# Patient Record
Sex: Male | Born: 1995 | Race: Black or African American | Hispanic: No | Marital: Single | Smoking: Never smoker
Health system: Southern US, Community
[De-identification: ages and names within clinical notes are randomized; demographics above are authoritative.]

## PROBLEM LIST (undated history)

## (undated) ENCOUNTER — Emergency Department (HOSPITAL_COMMUNITY): Payer: Commercial Managed Care - HMO | Source: Home / Self Care

## (undated) DIAGNOSIS — I10 Essential (primary) hypertension: Secondary | ICD-10-CM

## (undated) DIAGNOSIS — E559 Vitamin D deficiency, unspecified: Secondary | ICD-10-CM

## (undated) DIAGNOSIS — M869 Osteomyelitis, unspecified: Secondary | ICD-10-CM

## (undated) DIAGNOSIS — E611 Iron deficiency: Secondary | ICD-10-CM

## (undated) HISTORY — PX: FRACTURE SURGERY: SHX138

---
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
## 2015-04-24 ENCOUNTER — Inpatient Hospital Stay (HOSPITAL_COMMUNITY): Payer: Commercial Managed Care - HMO | Admitting: Anesthesiology

## 2015-04-24 ENCOUNTER — Inpatient Hospital Stay (HOSPITAL_COMMUNITY): Payer: Commercial Managed Care - HMO

## 2015-04-24 ENCOUNTER — Inpatient Hospital Stay (HOSPITAL_COMMUNITY)
Admission: EM | Admit: 2015-04-24 | Discharge: 2015-05-01 | DRG: 481 | Disposition: A | Discharge status: Rehabilitation Facility | Payer: Commercial Managed Care - HMO | Attending: Orthopedic Surgery | Admitting: Orthopedic Surgery

## 2015-04-24 ENCOUNTER — Encounter (HOSPITAL_COMMUNITY): Payer: Self-pay

## 2015-04-24 ENCOUNTER — Encounter (HOSPITAL_COMMUNITY): Admission: EM | Disposition: A | Discharge status: Rehabilitation Facility | Payer: Self-pay | Source: Home / Self Care

## 2015-04-24 ENCOUNTER — Emergency Department (HOSPITAL_COMMUNITY): Payer: Commercial Managed Care - HMO

## 2015-04-24 DIAGNOSIS — R7309 Other abnormal glucose: Secondary | ICD-10-CM | POA: Diagnosis present

## 2015-04-24 DIAGNOSIS — S71112S Laceration without foreign body, left thigh, sequela: Secondary | ICD-10-CM | POA: Diagnosis not present

## 2015-04-24 DIAGNOSIS — Z6841 Body Mass Index (BMI) 40.0 and over, adult: Secondary | ICD-10-CM | POA: Diagnosis not present

## 2015-04-24 DIAGNOSIS — D62 Acute posthemorrhagic anemia: Secondary | ICD-10-CM | POA: Diagnosis not present

## 2015-04-24 DIAGNOSIS — Z23 Encounter for immunization: Secondary | ICD-10-CM | POA: Diagnosis not present

## 2015-04-24 DIAGNOSIS — R7303 Prediabetes: Secondary | ICD-10-CM | POA: Diagnosis present

## 2015-04-24 DIAGNOSIS — S329XXG Fracture of unspecified parts of lumbosacral spine and pelvis, subsequent encounter for fracture with delayed healing: Secondary | ICD-10-CM | POA: Diagnosis not present

## 2015-04-24 DIAGNOSIS — S51011S Laceration without foreign body of right elbow, sequela: Secondary | ICD-10-CM | POA: Diagnosis not present

## 2015-04-24 DIAGNOSIS — L02416 Cutaneous abscess of left lower limb: Secondary | ICD-10-CM | POA: Diagnosis not present

## 2015-04-24 DIAGNOSIS — S32452A Displaced transverse fracture of left acetabulum, initial encounter for closed fracture: Secondary | ICD-10-CM | POA: Diagnosis present

## 2015-04-24 DIAGNOSIS — S32810A Multiple fractures of pelvis with stable disruption of pelvic ring, initial encounter for closed fracture: Principal | ICD-10-CM | POA: Diagnosis present

## 2015-04-24 DIAGNOSIS — E669 Obesity, unspecified: Secondary | ICD-10-CM | POA: Diagnosis present

## 2015-04-24 DIAGNOSIS — R609 Edema, unspecified: Secondary | ICD-10-CM | POA: Diagnosis not present

## 2015-04-24 DIAGNOSIS — S41121A Laceration with foreign body of right upper arm, initial encounter: Secondary | ICD-10-CM | POA: Diagnosis present

## 2015-04-24 DIAGNOSIS — Z419 Encounter for procedure for purposes other than remedying health state, unspecified: Secondary | ICD-10-CM

## 2015-04-24 DIAGNOSIS — S71112A Laceration without foreign body, left thigh, initial encounter: Secondary | ICD-10-CM | POA: Diagnosis present

## 2015-04-24 DIAGNOSIS — S0081XA Abrasion of other part of head, initial encounter: Secondary | ICD-10-CM | POA: Diagnosis present

## 2015-04-24 DIAGNOSIS — S32402A Unspecified fracture of left acetabulum, initial encounter for closed fracture: Secondary | ICD-10-CM | POA: Diagnosis present

## 2015-04-24 DIAGNOSIS — S32402S Unspecified fracture of left acetabulum, sequela: Secondary | ICD-10-CM | POA: Diagnosis not present

## 2015-04-24 DIAGNOSIS — S32810D Multiple fractures of pelvis with stable disruption of pelvic ring, subsequent encounter for fracture with routine healing: Secondary | ICD-10-CM | POA: Diagnosis not present

## 2015-04-24 DIAGNOSIS — S51011A Laceration without foreign body of right elbow, initial encounter: Secondary | ICD-10-CM | POA: Diagnosis present

## 2015-04-24 DIAGNOSIS — S32402D Unspecified fracture of left acetabulum, subsequent encounter for fracture with routine healing: Secondary | ICD-10-CM | POA: Diagnosis not present

## 2015-04-24 DIAGNOSIS — B9689 Other specified bacterial agents as the cause of diseases classified elsewhere: Secondary | ICD-10-CM | POA: Diagnosis not present

## 2015-04-24 DIAGNOSIS — S329XXA Fracture of unspecified parts of lumbosacral spine and pelvis, initial encounter for closed fracture: Secondary | ICD-10-CM

## 2015-04-24 DIAGNOSIS — M8618 Other acute osteomyelitis, other site: Secondary | ICD-10-CM | POA: Diagnosis not present

## 2015-04-24 DIAGNOSIS — S32810S Multiple fractures of pelvis with stable disruption of pelvic ring, sequela: Secondary | ICD-10-CM | POA: Diagnosis not present

## 2015-04-24 HISTORY — PX: INCISION AND DRAINAGE: SHX5863

## 2015-04-24 HISTORY — PX: SACRO-ILIAC PINNING: SHX5050

## 2015-04-24 HISTORY — PX: ORIF ACETABULAR FRACTURE: SHX5029

## 2015-04-24 HISTORY — PX: ORIF PELVIC FRACTURE: SHX2128

## 2015-04-24 LAB — ETHANOL: Alcohol, Ethyl (B): <5 mg/dL (ref <5)

## 2015-04-24 LAB — I-STAT CHEM 8, ED
BUN: 15 mg/dL (ref 6–20)
CREATININE: 1 mg/dL (ref 0.61–1.24)
Calcium, Ion: 1.07 mmol/L — ABNORMAL LOW (ref 1.12–1.23)
Chloride: 105 mmol/L (ref 101–111)
GLUCOSE: 150 mg/dL — AB (ref 65–99)
HCT: 44 % (ref 39.0–52.0)
Hemoglobin: 15 g/dL (ref 13.0–17.0)
Potassium: 3.4 mmol/L — ABNORMAL LOW (ref 3.5–5.1)
SODIUM: 141 mmol/L (ref 135–145)
TCO2: 22 mmol/L (ref 0–100)

## 2015-04-24 LAB — POCT I-STAT 7, (LYTES, BLD GAS, ICA,H+H)
Acid-base deficit: 3 mmol/L — ABNORMAL HIGH (ref 0.0–2.0)
BICARBONATE: 22.2 meq/L (ref 20.0–24.0)
Calcium, Ion: 1.17 mmol/L (ref 1.12–1.23)
HCT: 35 % — ABNORMAL LOW (ref 39.0–52.0)
HEMOGLOBIN: 11.9 g/dL — AB (ref 13.0–17.0)
O2 Saturation: 99 %
POTASSIUM: 4.3 mmol/L (ref 3.5–5.1)
Patient temperature: 37.3
SODIUM: 137 mmol/L (ref 135–145)
TCO2: 23 mmol/L (ref 0–100)
pCO2 arterial: 42.1 mmHg (ref 35.0–45.0)
pH, Arterial: 7.332 — ABNORMAL LOW (ref 7.350–7.450)
pO2, Arterial: 157 mmHg — ABNORMAL HIGH (ref 80.0–100.0)

## 2015-04-24 LAB — COMPREHENSIVE METABOLIC PANEL
ALK PHOS: 38 U/L (ref 38–126)
ALT: 59 U/L (ref 17–63)
AST: 53 U/L — ABNORMAL HIGH (ref 15–41)
Albumin: 3.6 g/dL (ref 3.5–5.0)
Anion gap: 11 (ref 5–15)
BILIRUBIN TOTAL: 0.5 mg/dL (ref 0.3–1.2)
BUN: 12 mg/dL (ref 6–20)
CO2: 23 mmol/L (ref 22–32)
Calcium: 8.9 mg/dL (ref 8.9–10.3)
Chloride: 107 mmol/L (ref 101–111)
Creatinine, Ser: 1.01 mg/dL (ref 0.61–1.24)
GFR calc Af Amer: >60 mL/min (ref >60)
GLUCOSE: 149 mg/dL — AB (ref 65–99)
POTASSIUM: 3.7 mmol/L (ref 3.5–5.1)
Sodium: 141 mmol/L (ref 135–145)
Total Protein: 6.3 g/dL — ABNORMAL LOW (ref 6.5–8.1)

## 2015-04-24 LAB — CBC
HCT: 43.4 % (ref 39.0–52.0)
Hemoglobin: 14.9 g/dL (ref 13.0–17.0)
MCH: 28.7 pg (ref 26.0–34.0)
MCHC: 34.3 g/dL (ref 30.0–36.0)
MCV: 83.6 fL (ref 78.0–100.0)
PLATELETS: 214 10*3/uL (ref 150–400)
RBC: 5.19 MIL/uL (ref 4.22–5.81)
RDW: 12.4 % (ref 11.5–15.5)
WBC: 9.1 10*3/uL (ref 4.0–10.5)

## 2015-04-24 LAB — PROTIME-INR
INR: 0.97 (ref 0.00–1.49)
Prothrombin Time: 13.1 seconds (ref 11.6–15.2)

## 2015-04-24 LAB — PREPARE RBC (CROSSMATCH)

## 2015-04-24 LAB — ABO/RH: ABO/RH(D): A POS

## 2015-04-24 LAB — CDS SEROLOGY

## 2015-04-24 SURGERY — OPEN REDUCTION INTERNAL FIXATION (ORIF) ACETABULAR FRACTURE
Anesthesia: General | Site: Pelvis | Laterality: Left

## 2015-04-24 MED ORDER — LABETALOL HCL 5 MG/ML IV SOLN
10.0000 mg | INTRAVENOUS | Status: DC | PRN
Start: 2015-04-24 — End: 2015-04-24
  Administered 2015-04-24 (×3): 10 mg via INTRAVENOUS

## 2015-04-24 MED ORDER — KCL IN DEXTROSE-NACL 20-5-0.45 MEQ/L-%-% IV SOLN
INTRAVENOUS | Status: DC
  Administered 2015-04-24: 23:00:00 via INTRAVENOUS
  Filled 2015-04-24 (×2): qty 1000

## 2015-04-24 MED ORDER — MIDAZOLAM HCL 2 MG/2ML IJ SOLN
INTRAMUSCULAR | Status: AC
Start: 2015-04-24 — End: 2015-04-24
  Filled 2015-04-24: qty 4

## 2015-04-24 MED ORDER — SUCCINYLCHOLINE CHLORIDE 20 MG/ML IJ SOLN
INTRAMUSCULAR | Status: DC | PRN
  Administered 2015-04-24: 200 mg via INTRAVENOUS

## 2015-04-24 MED ORDER — LIDOCAINE HCL (CARDIAC) 20 MG/ML IV SOLN
INTRAVENOUS | Status: DC | PRN
  Administered 2015-04-24: 40 mg via INTRAVENOUS

## 2015-04-24 MED ORDER — PROPOFOL 10 MG/ML IV BOLUS
INTRAVENOUS | Status: AC
  Filled 2015-04-24: qty 20

## 2015-04-24 MED ORDER — HYDROMORPHONE HCL 1 MG/ML IJ SOLN
INTRAMUSCULAR | Status: AC
  Administered 2015-04-24: 1 mg
  Filled 2015-04-24: qty 1

## 2015-04-24 MED ORDER — NEOSTIGMINE METHYLSULFATE 10 MG/10ML IV SOLN
INTRAVENOUS | Status: AC
  Filled 2015-04-24: qty 4

## 2015-04-24 MED ORDER — KETAMINE HCL 100 MG/ML IJ SOLN
INTRAMUSCULAR | Status: AC
  Filled 2015-04-24: qty 1

## 2015-04-24 MED ORDER — FENTANYL CITRATE (PF) 250 MCG/5ML IJ SOLN
INTRAMUSCULAR | Status: AC
  Filled 2015-04-24: qty 5

## 2015-04-24 MED ORDER — FENTANYL CITRATE (PF) 100 MCG/2ML IJ SOLN
INTRAMUSCULAR | Status: DC | PRN
  Administered 2015-04-24 (×4): 50 ug via INTRAVENOUS
  Administered 2015-04-24: 100 ug via INTRAVENOUS
  Administered 2015-04-24 (×6): 50 ug via INTRAVENOUS
  Administered 2015-04-24: 150 ug via INTRAVENOUS
  Administered 2015-04-24: 100 ug via INTRAVENOUS
  Administered 2015-04-24 (×2): 50 ug via INTRAVENOUS
  Administered 2015-04-24: 100 ug via INTRAVENOUS
  Administered 2015-04-24 (×9): 50 ug via INTRAVENOUS

## 2015-04-24 MED ORDER — HYDROMORPHONE HCL 1 MG/ML IJ SOLN
INTRAMUSCULAR | Status: AC
  Filled 2015-04-24: qty 2

## 2015-04-24 MED ORDER — NEOSTIGMINE METHYLSULFATE 10 MG/10ML IV SOLN
INTRAVENOUS | Status: AC
  Filled 2015-04-24: qty 1

## 2015-04-24 MED ORDER — SUGAMMADEX SODIUM 500 MG/5ML IV SOLN
INTRAVENOUS | Status: AC
  Filled 2015-04-24: qty 5

## 2015-04-24 MED ORDER — ONDANSETRON HCL 4 MG/2ML IJ SOLN
INTRAMUSCULAR | Status: AC
  Administered 2015-04-24: 4 mg via INTRAVENOUS
  Filled 2015-04-24: qty 2

## 2015-04-24 MED ORDER — HYDROMORPHONE HCL 1 MG/ML IJ SOLN
1.0000 mg | Freq: Once | INTRAMUSCULAR | Status: AC
  Administered 2015-04-26: 1 mg via INTRAVENOUS
  Filled 2015-04-24: qty 1

## 2015-04-24 MED ORDER — LABETALOL HCL 5 MG/ML IV SOLN
INTRAVENOUS | Status: AC
  Filled 2015-04-24: qty 4

## 2015-04-24 MED ORDER — SUCCINYLCHOLINE CHLORIDE 20 MG/ML IJ SOLN
INTRAMUSCULAR | Status: AC
  Filled 2015-04-24: qty 1

## 2015-04-24 MED ORDER — ROCURONIUM BROMIDE 100 MG/10ML IV SOLN
INTRAVENOUS | Status: DC | PRN
  Administered 2015-04-24: 10 mg via INTRAVENOUS
  Administered 2015-04-24: 50 mg via INTRAVENOUS
  Administered 2015-04-24: 20 mg via INTRAVENOUS
  Administered 2015-04-24: 10 mg via INTRAVENOUS
  Administered 2015-04-24: 20 mg via INTRAVENOUS
  Administered 2015-04-24: 30 mg via INTRAVENOUS
  Administered 2015-04-24 (×2): 20 mg via INTRAVENOUS
  Administered 2015-04-24 (×2): 10 mg via INTRAVENOUS
  Administered 2015-04-24: 20 mg via INTRAVENOUS
  Administered 2015-04-24: 10 mg via INTRAVENOUS

## 2015-04-24 MED ORDER — TETANUS-DIPHTH-ACELL PERTUSSIS 5-2.5-18.5 LF-MCG/0.5 IM SUSP
0.5000 mL | Freq: Once | INTRAMUSCULAR | Status: AC
  Administered 2015-04-24: 0.5 mL via INTRAMUSCULAR
  Filled 2015-04-24: qty 0.5

## 2015-04-24 MED ORDER — LACTATED RINGERS IV SOLN
INTRAVENOUS | Status: DC
  Administered 2015-04-24 (×2): 50 mL/h via INTRAVENOUS

## 2015-04-24 MED ORDER — HYDROMORPHONE HCL 1 MG/ML IJ SOLN
INTRAMUSCULAR | Status: AC | PRN
  Administered 2015-04-24: 0.5 mg via INTRAVENOUS

## 2015-04-24 MED ORDER — CEFAZOLIN SODIUM-DEXTROSE 2-3 GM-% IV SOLR
INTRAVENOUS | Status: AC
  Filled 2015-04-24: qty 50

## 2015-04-24 MED ORDER — LACTATED RINGERS IV SOLN
INTRAVENOUS | Status: DC | PRN
  Administered 2015-04-24 (×2): via INTRAVENOUS

## 2015-04-24 MED ORDER — ONDANSETRON HCL 4 MG/2ML IJ SOLN
INTRAMUSCULAR | Status: AC
  Filled 2015-04-24: qty 2

## 2015-04-24 MED ORDER — HYDROMORPHONE HCL 1 MG/ML IJ SOLN
1.0000 mg | INTRAMUSCULAR | Status: DC | PRN
Start: 2015-04-24 — End: 2015-04-25
  Administered 2015-04-24 – 2015-04-25 (×3): 1 mg via INTRAVENOUS
  Filled 2015-04-24 (×3): qty 1

## 2015-04-24 MED ORDER — ONDANSETRON HCL 4 MG PO TABS: 4.0000 mg | ORAL_TABLET | Freq: Four times a day (QID) | ORAL | Status: DC | PRN

## 2015-04-24 MED ORDER — HYDROMORPHONE HCL 1 MG/ML IJ SOLN
0.2500 mg | INTRAMUSCULAR | Status: DC | PRN
  Administered 2015-04-24 (×2): 1 mg via INTRAVENOUS

## 2015-04-24 MED ORDER — ACETAMINOPHEN 10 MG/ML IV SOLN
INTRAVENOUS | Status: DC | PRN
  Administered 2015-04-24: 1000 mg via INTRAVENOUS

## 2015-04-24 MED ORDER — PHENYLEPHRINE HCL 10 MG/ML IJ SOLN
10.0000 mg | INTRAVENOUS | Status: DC | PRN
  Administered 2015-04-24: 80 ug/min via INTRAVENOUS

## 2015-04-24 MED ORDER — HYDROMORPHONE HCL 1 MG/ML IJ SOLN
INTRAMUSCULAR | Status: AC
  Filled 2015-04-24: qty 1

## 2015-04-24 MED ORDER — ONDANSETRON HCL 4 MG/2ML IJ SOLN
4.0000 mg | Freq: Four times a day (QID) | INTRAMUSCULAR | Status: DC | PRN
  Filled 2015-04-24: qty 2

## 2015-04-24 MED ORDER — GLYCOPYRROLATE 0.2 MG/ML IJ SOLN
INTRAMUSCULAR | Status: AC
  Filled 2015-04-24: qty 3

## 2015-04-24 MED ORDER — ONDANSETRON HCL 4 MG/2ML IJ SOLN
INTRAMUSCULAR | Status: AC | PRN
  Administered 2015-04-24: 4 mg via INTRAVENOUS

## 2015-04-24 MED ORDER — ROCURONIUM BROMIDE 50 MG/5ML IV SOLN
INTRAVENOUS | Status: AC
  Filled 2015-04-24: qty 2

## 2015-04-24 MED ORDER — MIDAZOLAM HCL 5 MG/5ML IJ SOLN
INTRAMUSCULAR | Status: DC | PRN
  Administered 2015-04-24: 2 mg via INTRAVENOUS

## 2015-04-24 MED ORDER — PROMETHAZINE HCL 25 MG/ML IJ SOLN: 6.2500 mg | INTRAMUSCULAR | Status: DC | PRN

## 2015-04-24 MED ORDER — PHENYLEPHRINE 40 MCG/ML (10ML) SYRINGE FOR IV PUSH (FOR BLOOD PRESSURE SUPPORT)
PREFILLED_SYRINGE | INTRAVENOUS | Status: AC
  Filled 2015-04-24: qty 50

## 2015-04-24 MED ORDER — ACETAMINOPHEN 10 MG/ML IV SOLN
INTRAVENOUS | Status: AC
  Filled 2015-04-24: qty 100

## 2015-04-24 MED ORDER — SUGAMMADEX SODIUM 200 MG/2ML IV SOLN
INTRAVENOUS | Status: DC | PRN
  Administered 2015-04-24: 249.4 mg via INTRAVENOUS

## 2015-04-24 MED ORDER — ALBUMIN HUMAN 5 % IV SOLN
INTRAVENOUS | Status: DC | PRN
  Administered 2015-04-24: 17:00:00 via INTRAVENOUS

## 2015-04-24 MED ORDER — KETAMINE HCL 10 MG/ML IJ SOLN
INTRAMUSCULAR | Status: DC | PRN
  Administered 2015-04-24 (×2): 10 mg via INTRAVENOUS
  Administered 2015-04-24: 20 mg via INTRAVENOUS

## 2015-04-24 MED ORDER — PHENYLEPHRINE HCL 10 MG/ML IJ SOLN
INTRAMUSCULAR | Status: DC | PRN
  Administered 2015-04-24 (×10): 80 ug via INTRAVENOUS

## 2015-04-24 MED ORDER — PANTOPRAZOLE SODIUM 40 MG PO TBEC
40.0000 mg | DELAYED_RELEASE_TABLET | Freq: Every day | ORAL | Status: DC
  Administered 2015-04-25 – 2015-04-27 (×3): 40 mg via ORAL
  Filled 2015-04-24 (×3): qty 1

## 2015-04-24 MED ORDER — CEFAZOLIN SODIUM 10 G IJ SOLR
3.0000 g | INTRAMUSCULAR | Status: AC
  Filled 2015-04-24 (×3): qty 3000

## 2015-04-24 MED ORDER — 0.9 % SODIUM CHLORIDE (POUR BTL) OPTIME
TOPICAL | Status: DC | PRN
  Administered 2015-04-24 (×3): 1000 mL

## 2015-04-24 MED ORDER — HYDROMORPHONE HCL 1 MG/ML IJ SOLN
1.0000 mg | Freq: Once | INTRAMUSCULAR | Status: AC
  Administered 2015-04-24: 1 mg via INTRAVENOUS
  Filled 2015-04-24: qty 1

## 2015-04-24 MED ORDER — PROPOFOL 10 MG/ML IV BOLUS
INTRAVENOUS | Status: DC | PRN
  Administered 2015-04-24: 200 mg via INTRAVENOUS

## 2015-04-24 MED ORDER — MIDAZOLAM HCL 2 MG/2ML IJ SOLN
INTRAMUSCULAR | Status: AC
  Filled 2015-04-24: qty 2

## 2015-04-24 MED ORDER — SODIUM CHLORIDE 0.9 % IV SOLN: 10.0000 mL/h | Freq: Once | INTRAVENOUS | Status: DC

## 2015-04-24 MED ORDER — MIDAZOLAM HCL 2 MG/2ML IJ SOLN
0.5000 mg | Freq: Once | INTRAMUSCULAR | Status: AC | PRN
Start: 2015-04-24 — End: 2015-04-24
  Administered 2015-04-24: 2 mg via INTRAVENOUS

## 2015-04-24 MED ORDER — HYDROMORPHONE HCL 1 MG/ML IJ SOLN
0.2500 mg | INTRAMUSCULAR | Status: DC | PRN
  Administered 2015-04-24: 1 mg via INTRAVENOUS

## 2015-04-24 MED ORDER — PANTOPRAZOLE SODIUM 40 MG IV SOLR
40.0000 mg | Freq: Every day | INTRAVENOUS | Status: DC
  Filled 2015-04-24: qty 40

## 2015-04-24 MED ORDER — LIDOCAINE HCL (CARDIAC) 20 MG/ML IV SOLN
INTRAVENOUS | Status: AC
  Filled 2015-04-24: qty 5

## 2015-04-24 MED ORDER — IOHEXOL 300 MG/ML  SOLN
100.0000 mL | Freq: Once | INTRAMUSCULAR | Status: AC | PRN
  Administered 2015-04-24: 100 mL via INTRAVENOUS

## 2015-04-24 MED ORDER — DEXTROSE 5 % IV SOLN
3.0000 g | INTRAVENOUS | Status: DC | PRN
  Administered 2015-04-24 (×2): 3 g via INTRAVENOUS

## 2015-04-24 MED ORDER — LIDOCAINE-EPINEPHRINE 1 %-1:100000 IJ SOLN
20.0000 mL | Freq: Once | INTRAMUSCULAR | Status: DC
  Filled 2015-04-24: qty 1

## 2015-04-24 MED ORDER — ROCURONIUM BROMIDE 50 MG/5ML IV SOLN
INTRAVENOUS | Status: AC
  Filled 2015-04-24: qty 1

## 2015-04-24 MED ORDER — MEPERIDINE HCL 25 MG/ML IJ SOLN: 6.2500 mg | INTRAMUSCULAR | Status: DC | PRN

## 2015-04-24 MED ORDER — SODIUM CHLORIDE 0.9 % IR SOLN
Status: DC | PRN
  Administered 2015-04-24: 3000 mL

## 2015-04-24 SURGICAL SUPPLY — 115 items
APPLIER CLIP 11 MED OPEN (CLIP) ×4
APPLIER CLIP 13 LRG OPEN (CLIP) ×4
APR CLP LRG 13 20 CLIP (CLIP) ×3
APR CLP MED 11 20 MLT OPN (CLIP) ×3
BIT DRILL AO MATTA 2.5MX230M (BIT) IMPLANT
BIT DRILL SCALD PELVS II 2.5MM (BIT) IMPLANT
BLADE SURG 10 STRL SS (BLADE) ×2 IMPLANT
BLADE SURG 15 STRL LF DISP TIS (BLADE) ×3 IMPLANT
BLADE SURG 15 STRL SS (BLADE) ×4
BLADE SURG ROTATE 9660 (MISCELLANEOUS) ×1 IMPLANT
BNDG GAUZE ELAST 4 BULKY (GAUZE/BANDAGES/DRESSINGS) ×1 IMPLANT
BRUSH SCRUB DISP (MISCELLANEOUS) ×10 IMPLANT
CANISTER WOUND CARE 500ML ATS (WOUND CARE) ×1 IMPLANT
CLIP APPLIE 11 MED OPEN (CLIP) IMPLANT
CLIP APPLIE 13 LRG OPEN (CLIP) IMPLANT
COVER SURGICAL LIGHT HANDLE (MISCELLANEOUS) ×8 IMPLANT
DRAIN CHANNEL 10F 3/8 F FF (DRAIN) IMPLANT
DRAIN CHANNEL 15F RND FF W/TCR (WOUND CARE) IMPLANT
DRAIN PENROSE 3/4X12 (DRAIN) ×1 IMPLANT
DRAPE C-ARM 42X72 X-RAY (DRAPES) ×1 IMPLANT
DRAPE C-ARMOR (DRAPES) ×4 IMPLANT
DRAPE IMP U-DRAPE 54X76 (DRAPES) ×3 IMPLANT
DRAPE INCISE IOBAN 66X45 STRL (DRAPES) ×4 IMPLANT
DRAPE INCISE IOBAN 85X60 (DRAPES) ×8 IMPLANT
DRAPE LAPAROTOMY TRNSV 102X78 (DRAPE) ×4 IMPLANT
DRAPE ORTHO SPLIT 77X108 STRL (DRAPES) ×8
DRAPE SURG ORHT 6 SPLT 77X108 (DRAPES) ×6 IMPLANT
DRAPE U-SHAPE 47X51 STRL (DRAPES) ×3 IMPLANT
DRILL BIT AO MATTA 2.5MX230M (BIT) ×4
DRILL SCALED PELVIS II 2.5MM (BIT) ×4
DRSG MEPILEX BORDER 4X4 (GAUZE/BANDAGES/DRESSINGS) ×2 IMPLANT
DRSG MEPILEX BORDER 4X8 (GAUZE/BANDAGES/DRESSINGS) ×1 IMPLANT
DRSG MEPITEL 4X7.2 (GAUZE/BANDAGES/DRESSINGS) ×1 IMPLANT
DRSG PAD ABDOMINAL 8X10 ST (GAUZE/BANDAGES/DRESSINGS) ×6 IMPLANT
DRSG VAC ATS SM SENSATRAC (GAUZE/BANDAGES/DRESSINGS) ×1 IMPLANT
DRSG VERSA FOAM LRG 10X15 (GAUZE/BANDAGES/DRESSINGS) ×2 IMPLANT
ELECT BLADE 6.5 EXT (BLADE) ×2 IMPLANT
ELECT REM PT RETURN 9FT ADLT (ELECTROSURGICAL) ×4
ELECTRODE REM PT RTRN 9FT ADLT (ELECTROSURGICAL) ×3 IMPLANT
EVACUATOR 1/8 PVC DRAIN (DRAIN) IMPLANT
EVACUATOR SILICONE 100CC (DRAIN) ×3 IMPLANT
GAUZE SPONGE 4X4 12PLY STRL (GAUZE/BANDAGES/DRESSINGS) ×4 IMPLANT
GAUZE SPONGE 4X4 16PLY XRAY LF (GAUZE/BANDAGES/DRESSINGS) ×3 IMPLANT
GAUZE XEROFORM 5X9 LF (GAUZE/BANDAGES/DRESSINGS) ×3 IMPLANT
GLOVE BIO SURGEON STRL SZ7.5 (GLOVE) ×3 IMPLANT
GLOVE BIO SURGEON STRL SZ8 (GLOVE) ×6 IMPLANT
GLOVE BIOGEL PI IND STRL 7.5 (GLOVE) ×3 IMPLANT
GLOVE BIOGEL PI IND STRL 8 (GLOVE) ×3 IMPLANT
GLOVE BIOGEL PI INDICATOR 7.5 (GLOVE)
GLOVE BIOGEL PI INDICATOR 8 (GLOVE) ×2
GOWN STRL REUS W/ TWL LRG LVL3 (GOWN DISPOSABLE) ×6 IMPLANT
GOWN STRL REUS W/ TWL XL LVL3 (GOWN DISPOSABLE) ×3 IMPLANT
GOWN STRL REUS W/TWL 2XL LVL3 (GOWN DISPOSABLE) ×3 IMPLANT
GOWN STRL REUS W/TWL LRG LVL3 (GOWN DISPOSABLE) ×8
GOWN STRL REUS W/TWL XL LVL3 (GOWN DISPOSABLE) ×4
GUIDE PIN 2.8 X 300 ×2 IMPLANT
GUIDEPIN 2.8X300 THRD TIP OIC (Screw) ×1 IMPLANT
HANDPIECE INTERPULSE COAX TIP (DISPOSABLE)
K-WIRE 3.2X150 (WIRE) ×8
KIT BASIN OR (CUSTOM PROCEDURE TRAY) ×4 IMPLANT
KIT ROOM TURNOVER OR (KITS) ×4 IMPLANT
KWIRE 3.2X150 (WIRE) IMPLANT
LIGHT ORTHO (MISCELLANEOUS) ×1 IMPLANT
LOOP VESSEL MAXI BLUE (MISCELLANEOUS) ×1 IMPLANT
MANIFOLD NEPTUNE WASTE (CANNULA) ×1 IMPLANT
NDL MAYO TROCAR (NEEDLE) ×3 IMPLANT
NEEDLE MAYO TROCAR (NEEDLE) IMPLANT
NS IRRIG 1000ML POUR BTL (IV SOLUTION) ×5 IMPLANT
PACK GENERAL/GYN (CUSTOM PROCEDURE TRAY) ×3 IMPLANT
PACK TOTAL JOINT (CUSTOM PROCEDURE TRAY) ×4 IMPLANT
PACK UNIVERSAL I (CUSTOM PROCEDURE TRAY) ×3 IMPLANT
PAD ARMBOARD 7.5X6 YLW CONV (MISCELLANEOUS) ×10 IMPLANT
PENCIL BUTTON HOLSTER BLD 10FT (ELECTRODE) ×1 IMPLANT
PIPE LIGHT STORZ FITTING (MISCELLANEOUS) ×1 IMPLANT
PLATE ACET STRT 94.5M 8H (Plate) ×1 IMPLANT
PLATE INFRAPECTINEAL L L (Plate) ×1 IMPLANT
PLATE INFRAPECTINEAL S L (Plate) ×1 IMPLANT
RETRIEVER SUT HEWSON (MISCELLANEOUS) IMPLANT
SCREW CANN 7.3X90 32MM THRD (Screw) ×1 IMPLANT
SCREW CORT 48X3.5XST NS LF (Screw) IMPLANT
SCREW CORTEX ST MATTA 3.5X18MM (Screw) ×1 IMPLANT
SCREW CORTEX ST MATTA 3.5X20 (Screw) ×1 IMPLANT
SCREW CORTEX ST MATTA 3.5X22MM (Screw) ×1 IMPLANT
SCREW CORTEX ST MATTA 3.5X24 (Screw) ×2 IMPLANT
SCREW CORTEX ST MATTA 3.5X26MM (Screw) ×1 IMPLANT
SCREW CORTEX ST MATTA 3.5X28MM (Screw) ×2 IMPLANT
SCREW CORTEX ST MATTA 3.5X30MM (Screw) ×3 IMPLANT
SCREW CORTEX ST MATTA 3.5X32MM (Screw) ×1 IMPLANT
SCREW CORTEX ST MATTA 3.5X34MM (Screw) ×1 IMPLANT
SCREW CORTEX ST MATTA 3.5X50MM (Screw) ×1 IMPLANT
SCREW CORTEX ST MATTA 3.5X70MM (Screw) ×1 IMPLANT
SCREW CORTICAL 3.5X48MM (Screw) ×8 IMPLANT
SET HNDPC FAN SPRY TIP SCT (DISPOSABLE) IMPLANT
SPONGE LAP 18X18 X RAY DECT (DISPOSABLE) ×12 IMPLANT
STAPLER VISISTAT 35W (STAPLE) ×4 IMPLANT
STRIP CLOSURE SKIN 1/2X4 (GAUZE/BANDAGES/DRESSINGS) ×4 IMPLANT
SUCTION FRAZIER TIP 10 FR DISP (SUCTIONS) ×4 IMPLANT
SUT ETHILON 3 0 PS 1 (SUTURE) ×6 IMPLANT
SUT FIBERWIRE #2 38 T-5 BLUE (SUTURE)
SUT PDS AB 2-0 CT1 27 (SUTURE) ×2 IMPLANT
SUT SILK 2 0 TIES 10X30 (SUTURE) ×1 IMPLANT
SUT VIC AB 0 CT1 27 (SUTURE) ×8
SUT VIC AB 0 CT1 27XBRD ANBCTR (SUTURE) ×3 IMPLANT
SUT VIC AB 1 CT1 18XCR BRD 8 (SUTURE) ×3 IMPLANT
SUT VIC AB 1 CT1 8-18 (SUTURE) ×8
SUT VIC AB 2-0 CT1 27 (SUTURE) ×8
SUT VIC AB 2-0 CT1 TAPERPNT 27 (SUTURE) ×3 IMPLANT
SUT VIC AB 2-0 FS1 27 (SUTURE) ×3 IMPLANT
SUTURE FIBERWR #2 38 T-5 BLUE (SUTURE) IMPLANT
TOWEL OR 17X24 6PK STRL BLUE (TOWEL DISPOSABLE) ×4 IMPLANT
TOWEL OR 17X26 10 PK STRL BLUE (TOWEL DISPOSABLE) ×8 IMPLANT
TRAY FOLEY CATH 16FRSI W/METER (SET/KITS/TRAYS/PACK) ×1 IMPLANT
TUBE CONNECTING 12X1/4 (SUCTIONS) ×1 IMPLANT
UNDERPAD 30X30 INCONTINENT (UNDERPADS AND DIAPERS) ×4 IMPLANT
WASHER SCREW MATTA SS 13.0X1.5 (Washer) ×1 IMPLANT

## 2015-04-24 NOTE — ED Notes (Signed)
Dr Burke at bedside.
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
## 2015-04-24 NOTE — ED Notes (Signed)
C-collar removed by Dr Clarene Duke.
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
## 2015-04-24 NOTE — ED Notes (Signed)
Dr Carola Frost to see pt in OR

## 2015-04-24 NOTE — Transfer of Care (Signed)
Immediate Anesthesia Transfer of Care Note  Patient: Mathew Norman  Procedure(s) Performed: Procedure(s): OPEN REDUCTION INTERNAL FIXATION (ORIF) ACETABULAR FRACTURE  (Left) SACRO-ILIAC PINNING (Left) INCISION AND DRAINAGE of left  thigh open wound, closure of right upper arm laceration. (Left) OPEN REDUCTION INTERNAL FIXATION (ORIF) PELVIC  RING FRACTURE (Left)  Patient Location: PACU  Anesthesia Type:General  Level of Consciousness: awake, alert , oriented and patient cooperative  Airway & Oxygen Therapy: Patient Spontanous Breathing and Patient connected to nasal cannula oxygen  Post-op Assessment: Report given to RN, Post -op Vital signs reviewed and stable, Patient moving all extremities, Patient moving all extremities X 4 and Patient able to stick tongue midline  Post vital signs: Reviewed and stable  Last Vitals:  Filed Vitals:   04/24/15 1045  BP: 120/57  Pulse: 103  Temp:   Resp: 8    Complications: no anesthesia complications

## 2015-04-24 NOTE — ED Notes (Signed)
Dr Clarene Duke suturing pt.
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
## 2015-04-24 NOTE — Anesthesia Procedure Notes (Signed)
Procedure Name: Intubation Date/Time: 04/24/2015 12:30 PM Performed by: [employer], Jannet Askew Pre-anesthesia Checklist: Patient identified, Emergency Drugs available, Suction available, Patient being monitored and Timeout performed Patient Re-evaluated:Patient Re-evaluated prior to inductionOxygen Delivery Method: Circle system utilized Preoxygenation: Pre-oxygenation with 100% oxygen Intubation Type: IV induction Laryngoscope Size: Mac and 4 Grade View: Grade I Tube size: 7.5 mm Number of attempts: 1 Airway Equipment and Method: Patient positioned with wedge pillow Placement Confirmation: ETT inserted through vocal cords under direct vision,  breath sounds checked- equal and bilateral and positive ETCO2 Secured at: 23 cm Dental Injury: Teeth and Oropharynx as per pre-operative assessment
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
## 2015-04-24 NOTE — ED Notes (Signed)
Pt leaving for CT.  Pain meds requested.  Pt highly anxious.

## 2015-04-25 ENCOUNTER — Encounter (HOSPITAL_COMMUNITY): Payer: Self-pay | Admitting: Orthopedic Surgery

## 2015-04-25 LAB — BASIC METABOLIC PANEL
Anion gap: 10 (ref 5–15)
BUN: 7 mg/dL (ref 6–20)
CALCIUM: 8 mg/dL — AB (ref 8.9–10.3)
CO2: 24 mmol/L (ref 22–32)
Chloride: 102 mmol/L (ref 101–111)
Creatinine, Ser: 0.86 mg/dL (ref 0.61–1.24)
GLUCOSE: 138 mg/dL — AB (ref 65–99)
Potassium: 3.6 mmol/L (ref 3.5–5.1)
SODIUM: 136 mmol/L (ref 135–145)

## 2015-04-25 LAB — CBC
HCT: 34.3 % — ABNORMAL LOW (ref 39.0–52.0)
HCT: 33.5 % — ABNORMAL LOW (ref 39.0–52.0)
HEMATOCRIT: 33.5 % — AB (ref 39.0–52.0)
HEMOGLOBIN: 11.5 g/dL — AB (ref 13.0–17.0)
Hemoglobin: 11.8 g/dL — ABNORMAL LOW (ref 13.0–17.0)
Hemoglobin: 11.3 g/dL — ABNORMAL LOW (ref 13.0–17.0)
MCH: 28.6 pg (ref 26.0–34.0)
MCH: 29.1 pg (ref 26.0–34.0)
MCH: 28.3 pg (ref 26.0–34.0)
MCHC: 34.4 g/dL (ref 30.0–36.0)
MCHC: 34.3 g/dL (ref 30.0–36.0)
MCHC: 33.7 g/dL (ref 30.0–36.0)
MCV: 83.3 fL (ref 78.0–100.0)
MCV: 84.8 fL (ref 78.0–100.0)
MCV: 84 fL (ref 78.0–100.0)
Platelets: 198 10*3/uL (ref 150–400)
Platelets: 191 10*3/uL (ref 150–400)
Platelets: 189 10*3/uL (ref 150–400)
RBC: 4.12 MIL/uL — ABNORMAL LOW (ref 4.22–5.81)
RBC: 3.95 MIL/uL — AB (ref 4.22–5.81)
RBC: 3.99 MIL/uL — AB (ref 4.22–5.81)
RDW: 12.8 % (ref 11.5–15.5)
RDW: 12.9 % (ref 11.5–15.5)
RDW: 12.7 % (ref 11.5–15.5)
WBC: 8 10*3/uL (ref 4.0–10.5)
WBC: 7.7 10*3/uL (ref 4.0–10.5)
WBC: 9 10*3/uL (ref 4.0–10.5)

## 2015-04-25 LAB — MRSA PCR SCREENING: MRSA BY PCR: NEGATIVE

## 2015-04-25 MED ORDER — METOPROLOL TARTRATE 1 MG/ML IV SOLN
5.0000 mg | Freq: Four times a day (QID) | INTRAVENOUS | Status: DC | PRN
  Filled 2015-04-25: qty 5

## 2015-04-25 MED ORDER — OXYCODONE-ACETAMINOPHEN 5-325 MG PO TABS
1.0000 | ORAL_TABLET | ORAL | Status: DC | PRN
  Administered 2015-04-25 (×2): 2 via ORAL
  Filled 2015-04-25 (×2): qty 2

## 2015-04-25 MED ORDER — SODIUM CHLORIDE 0.9 % IV SOLN: 250.0000 mL | INTRAVENOUS | Status: DC | PRN

## 2015-04-25 MED ORDER — ACETAMINOPHEN 325 MG PO TABS
650.0000 mg | ORAL_TABLET | Freq: Four times a day (QID) | ORAL | Status: DC | PRN
Start: 2015-04-25 — End: 2015-05-01
  Administered 2015-04-25 – 2015-04-28 (×5): 650 mg via ORAL
  Filled 2015-04-25 (×5): qty 2

## 2015-04-25 MED ORDER — DIPHENHYDRAMINE HCL 25 MG PO CAPS
25.0000 mg | ORAL_CAPSULE | Freq: Four times a day (QID) | ORAL | Status: DC | PRN
  Administered 2015-04-25: 25 mg via ORAL
  Filled 2015-04-25: qty 1

## 2015-04-25 MED ORDER — DEXTROSE 5 % IV SOLN
3.0000 g | INTRAVENOUS | Status: DC
  Filled 2015-04-25: qty 3000

## 2015-04-25 MED ORDER — SODIUM CHLORIDE 0.9 % IJ SOLN
3.0000 mL | Freq: Two times a day (BID) | INTRAMUSCULAR | Status: DC
  Administered 2015-04-25 – 2015-04-28 (×5): 3 mL via INTRAVENOUS

## 2015-04-25 MED ORDER — DIPHENHYDRAMINE HCL 50 MG/ML IJ SOLN: 25.0000 mg | Freq: Four times a day (QID) | INTRAMUSCULAR | Status: DC | PRN

## 2015-04-25 MED ORDER — METHOCARBAMOL 750 MG PO TABS
750.0000 mg | ORAL_TABLET | Freq: Three times a day (TID) | ORAL | Status: DC
  Administered 2015-04-25 – 2015-05-01 (×18): 750 mg via ORAL
  Filled 2015-04-25 (×22): qty 1

## 2015-04-25 MED ORDER — HYDROMORPHONE HCL 1 MG/ML IJ SOLN
1.0000 mg | INTRAMUSCULAR | Status: DC | PRN
  Administered 2015-04-25: 1 mg via INTRAVENOUS
  Administered 2015-04-25: 2 mg via INTRAVENOUS
  Filled 2015-04-25: qty 2

## 2015-04-25 MED ORDER — SODIUM CHLORIDE 0.9 % IJ SOLN: 3.0000 mL | INTRAMUSCULAR | Status: DC | PRN

## 2015-04-25 MED ORDER — HYDROMORPHONE HCL 1 MG/ML IJ SOLN
1.0000 mg | INTRAMUSCULAR | Status: DC | PRN
  Administered 2015-04-25 – 2015-04-27 (×11): 2 mg via INTRAVENOUS
  Administered 2015-04-27 – 2015-04-28 (×5): 1 mg via INTRAVENOUS
  Administered 2015-04-28: 2 mg via INTRAVENOUS
  Administered 2015-04-28 (×3): 1 mg via INTRAVENOUS
  Administered 2015-04-28: 2 mg via INTRAVENOUS
  Administered 2015-04-29: 1 mg via INTRAVENOUS
  Filled 2015-04-25: qty 2
  Filled 2015-04-25 (×6): qty 1
  Filled 2015-04-25: qty 2
  Filled 2015-04-25: qty 1
  Filled 2015-04-25 (×7): qty 2
  Filled 2015-04-25: qty 1
  Filled 2015-04-25 (×2): qty 2
  Filled 2015-04-25: qty 1
  Filled 2015-04-25 (×2): qty 2
  Filled 2015-04-25: qty 1

## 2015-04-25 MED ORDER — OXYCODONE HCL 5 MG PO TABS
10.0000 mg | ORAL_TABLET | ORAL | Status: DC | PRN
  Administered 2015-04-25 – 2015-04-29 (×14): 20 mg via ORAL
  Administered 2015-04-29: 10 mg via ORAL
  Administered 2015-04-29: 20 mg via ORAL
  Administered 2015-04-29: 10 mg via ORAL
  Administered 2015-04-29 – 2015-05-01 (×10): 20 mg via ORAL
  Filled 2015-04-25 (×6): qty 4
  Filled 2015-04-25: qty 2
  Filled 2015-04-25 (×11): qty 4
  Filled 2015-04-25: qty 2
  Filled 2015-04-25 (×8): qty 4
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
## 2015-04-26 NOTE — Progress Notes (Signed)
Pt report called to new room. All belongs packed and will be sent to new room with patient. Family at bedside.

## 2015-04-26 NOTE — Progress Notes (Signed)
Pt had a fever of 100.5 orally  upon admission at 3 pm.  Gave tylenol and encourage pt to use incentive spirometry q10 mins. Recheck at 1840, was 99.6 (axillary). Will continue to monitor pt.

## 2015-04-27 MED ORDER — DEXTROSE 5 % IV SOLN
3.0000 g | INTRAVENOUS | Status: DC
  Filled 2015-04-27 (×2): qty 3000

## 2015-04-27 MED ORDER — POTASSIUM CHLORIDE 2 MEQ/ML IV SOLN
INTRAVENOUS | Status: DC
  Administered 2015-04-28: 01:00:00 via INTRAVENOUS
  Filled 2015-04-27 (×4): qty 1000

## 2015-04-27 MED ORDER — DEXTROSE 5 % IV SOLN
3.0000 g | INTRAVENOUS | Status: DC
  Filled 2015-04-27: qty 3000
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
## 2015-04-28 ENCOUNTER — Encounter (HOSPITAL_COMMUNITY): Payer: Self-pay | Admitting: Certified Registered Nurse Anesthetist

## 2015-04-28 ENCOUNTER — Inpatient Hospital Stay (HOSPITAL_COMMUNITY): Payer: Commercial Managed Care - HMO | Admitting: Certified Registered"

## 2015-04-28 ENCOUNTER — Encounter (HOSPITAL_COMMUNITY): Admission: EM | Disposition: A | Discharge status: Rehabilitation Facility | Payer: Self-pay | Source: Home / Self Care

## 2015-04-28 HISTORY — PX: DEBRIDEMENT AND CLOSURE WOUND: SHX5614

## 2015-04-28 LAB — BASIC METABOLIC PANEL
Anion gap: 7 (ref 5–15)
BUN: 7 mg/dL (ref 6–20)
CHLORIDE: 98 mmol/L — AB (ref 101–111)
CO2: 27 mmol/L (ref 22–32)
Calcium: 8.1 mg/dL — ABNORMAL LOW (ref 8.9–10.3)
Creatinine, Ser: 0.76 mg/dL (ref 0.61–1.24)
Glucose, Bld: 248 mg/dL — ABNORMAL HIGH (ref 65–99)
POTASSIUM: 4.4 mmol/L (ref 3.5–5.1)
SODIUM: 132 mmol/L — AB (ref 135–145)

## 2015-04-28 LAB — TYPE AND SCREEN
ABO/RH(D): A POS
ANTIBODY SCREEN: NEGATIVE
UNIT DIVISION: 0
Unit division: 0

## 2015-04-28 LAB — CBC
HCT: 29.4 % — ABNORMAL LOW (ref 39.0–52.0)
HEMOGLOBIN: 9.8 g/dL — AB (ref 13.0–17.0)
MCH: 28.7 pg (ref 26.0–34.0)
MCHC: 33.3 g/dL (ref 30.0–36.0)
MCV: 86 fL (ref 78.0–100.0)
PLATELETS: 222 10*3/uL (ref 150–400)
RBC: 3.42 MIL/uL — AB (ref 4.22–5.81)
RDW: 13.1 % (ref 11.5–15.5)
WBC: 6.8 10*3/uL (ref 4.0–10.5)

## 2015-04-28 SURGERY — DEBRIDEMENT, WOUND, WITH CLOSURE
Anesthesia: General | Site: Leg Upper | Laterality: Left

## 2015-04-28 MED ORDER — NEOSTIGMINE METHYLSULFATE 10 MG/10ML IV SOLN
INTRAVENOUS | Status: DC | PRN
  Administered 2015-04-28: 3 mg via INTRAVENOUS

## 2015-04-28 MED ORDER — ROCURONIUM BROMIDE 50 MG/5ML IV SOLN
INTRAVENOUS | Status: AC
Start: 2015-04-28 — End: 2015-04-28
  Filled 2015-04-28: qty 1

## 2015-04-28 MED ORDER — CEFAZOLIN SODIUM-DEXTROSE 2-3 GM-% IV SOLR
2.0000 g | Freq: Three times a day (TID) | INTRAVENOUS | Status: AC
  Administered 2015-04-28 – 2015-04-29 (×3): 2 g via INTRAVENOUS
  Filled 2015-04-28 (×3): qty 50

## 2015-04-28 MED ORDER — HYDROMORPHONE HCL 1 MG/ML IJ SOLN
INTRAMUSCULAR | Status: AC
  Filled 2015-04-28: qty 1

## 2015-04-28 MED ORDER — FENTANYL CITRATE (PF) 250 MCG/5ML IJ SOLN
INTRAMUSCULAR | Status: AC
  Filled 2015-04-28: qty 5

## 2015-04-28 MED ORDER — FENTANYL CITRATE (PF) 100 MCG/2ML IJ SOLN
INTRAMUSCULAR | Status: AC
  Filled 2015-04-28: qty 2

## 2015-04-28 MED ORDER — DEXTROSE 5 % IV SOLN
3.0000 g | INTRAVENOUS | Status: DC | PRN
  Administered 2015-04-28: 3 g via INTRAVENOUS

## 2015-04-28 MED ORDER — ONDANSETRON HCL 4 MG/2ML IJ SOLN
INTRAMUSCULAR | Status: DC | PRN
  Administered 2015-04-28 (×2): 4 mg via INTRAVENOUS

## 2015-04-28 MED ORDER — TRAMADOL HCL 50 MG PO TABS
100.0000 mg | ORAL_TABLET | Freq: Four times a day (QID) | ORAL | Status: DC
  Administered 2015-04-28 – 2015-05-01 (×12): 100 mg via ORAL
  Filled 2015-04-28 (×12): qty 2

## 2015-04-28 MED ORDER — ROCURONIUM BROMIDE 100 MG/10ML IV SOLN
INTRAVENOUS | Status: DC | PRN
  Administered 2015-04-28: 20 mg via INTRAVENOUS

## 2015-04-28 MED ORDER — FENTANYL CITRATE (PF) 100 MCG/2ML IJ SOLN
INTRAMUSCULAR | Status: DC | PRN
  Administered 2015-04-28: 100 ug via INTRAVENOUS
  Administered 2015-04-28 (×3): 50 ug via INTRAVENOUS
  Administered 2015-04-28: 150 ug via INTRAVENOUS

## 2015-04-28 MED ORDER — HYDROMORPHONE HCL 1 MG/ML IJ SOLN
0.2500 mg | INTRAMUSCULAR | Status: DC | PRN
  Administered 2015-04-28 (×4): 0.5 mg via INTRAVENOUS

## 2015-04-28 MED ORDER — PHENYLEPHRINE HCL 10 MG/ML IJ SOLN
INTRAMUSCULAR | Status: DC | PRN
  Administered 2015-04-28 (×2): 80 ug via INTRAVENOUS
  Administered 2015-04-28: 120 ug via INTRAVENOUS
  Administered 2015-04-28: 80 ug via INTRAVENOUS
  Administered 2015-04-28: 120 ug via INTRAVENOUS
  Administered 2015-04-28: 80 ug via INTRAVENOUS

## 2015-04-28 MED ORDER — FENTANYL CITRATE (PF) 100 MCG/2ML IJ SOLN
50.0000 ug | Freq: Once | INTRAMUSCULAR | Status: AC
Start: 2015-04-28 — End: 2015-04-28
  Administered 2015-04-28: 50 ug via INTRAVENOUS

## 2015-04-28 MED ORDER — FENTANYL CITRATE (PF) 100 MCG/2ML IJ SOLN: 50.0000 ug | INTRAMUSCULAR | Status: DC | PRN

## 2015-04-28 MED ORDER — PROPOFOL 10 MG/ML IV BOLUS
INTRAVENOUS | Status: DC | PRN
  Administered 2015-04-28: 200 mg via INTRAVENOUS

## 2015-04-28 MED ORDER — LIDOCAINE HCL (CARDIAC) 20 MG/ML IV SOLN
INTRAVENOUS | Status: AC
  Filled 2015-04-28: qty 5

## 2015-04-28 MED ORDER — LIDOCAINE HCL (CARDIAC) 20 MG/ML IV SOLN
INTRAVENOUS | Status: DC | PRN
  Administered 2015-04-28: 80 mg via INTRAVENOUS

## 2015-04-28 MED ORDER — DOCUSATE SODIUM 100 MG PO CAPS
100.0000 mg | ORAL_CAPSULE | Freq: Two times a day (BID) | ORAL | Status: DC
  Administered 2015-04-28 – 2015-05-01 (×6): 100 mg via ORAL
  Filled 2015-04-28 (×6): qty 1

## 2015-04-28 MED ORDER — PROMETHAZINE HCL 25 MG/ML IJ SOLN: 6.2500 mg | INTRAMUSCULAR | Status: DC | PRN

## 2015-04-28 MED ORDER — MIDAZOLAM HCL 2 MG/2ML IJ SOLN
1.5000 mg | Freq: Once | INTRAMUSCULAR | Status: AC
  Administered 2015-04-28: 1.5 mg via INTRAVENOUS

## 2015-04-28 MED ORDER — MIDAZOLAM HCL 2 MG/2ML IJ SOLN
INTRAMUSCULAR | Status: AC
  Filled 2015-04-28: qty 4

## 2015-04-28 MED ORDER — PROPOFOL 10 MG/ML IV BOLUS
INTRAVENOUS | Status: AC
  Filled 2015-04-28: qty 20

## 2015-04-28 MED ORDER — GLYCOPYRROLATE 0.2 MG/ML IJ SOLN
INTRAMUSCULAR | Status: DC | PRN
  Administered 2015-04-28: .4 mg via INTRAVENOUS

## 2015-04-28 MED ORDER — MIDAZOLAM HCL 2 MG/2ML IJ SOLN: 1.0000 mg | INTRAMUSCULAR | Status: DC | PRN

## 2015-04-28 MED ORDER — MIDAZOLAM HCL 2 MG/2ML IJ SOLN
INTRAMUSCULAR | Status: AC
Start: 2015-04-28 — End: 2015-04-28
  Administered 2015-04-28: 1.5 mg via INTRAVENOUS
  Filled 2015-04-28: qty 2

## 2015-04-28 MED ORDER — 0.9 % SODIUM CHLORIDE (POUR BTL) OPTIME
TOPICAL | Status: DC | PRN
  Administered 2015-04-28: 1000 mL

## 2015-04-28 MED ORDER — LACTATED RINGERS IV SOLN
INTRAVENOUS | Status: DC
  Administered 2015-04-28 (×2): via INTRAVENOUS

## 2015-04-28 MED ORDER — POLYETHYLENE GLYCOL 3350 17 G PO PACK
17.0000 g | PACK | Freq: Every day | ORAL | Status: DC
  Administered 2015-04-29 – 2015-05-01 (×3): 17 g via ORAL
  Filled 2015-04-28 (×3): qty 1

## 2015-04-28 MED ORDER — SUCCINYLCHOLINE CHLORIDE 20 MG/ML IJ SOLN
INTRAMUSCULAR | Status: DC | PRN
  Administered 2015-04-28: 120 mg via INTRAVENOUS

## 2015-04-28 SURGICAL SUPPLY — 50 items
BLADE SURG 10 STRL SS (BLADE) ×2 IMPLANT
BNDG CMPR MED 10X6 ELC LF (GAUZE/BANDAGES/DRESSINGS) ×1
BNDG COHESIVE 4X5 TAN STRL (GAUZE/BANDAGES/DRESSINGS) ×2 IMPLANT
BNDG ELASTIC 6X10 VLCR STRL LF (GAUZE/BANDAGES/DRESSINGS) ×1 IMPLANT
BNDG GAUZE ELAST 4 BULKY (GAUZE/BANDAGES/DRESSINGS) ×3 IMPLANT
BNDG GAUZE STRTCH 6 (GAUZE/BANDAGES/DRESSINGS) ×6 IMPLANT
BRUSH SCRUB DISP (MISCELLANEOUS) ×4 IMPLANT
CANISTER WOUND CARE 500ML ATS (WOUND CARE) ×1 IMPLANT
COVER SURGICAL LIGHT HANDLE (MISCELLANEOUS) ×4 IMPLANT
DRAPE U-SHAPE 47X51 STRL (DRAPES) ×2 IMPLANT
DRSG ADAPTIC 3X8 NADH LF (GAUZE/BANDAGES/DRESSINGS) ×2 IMPLANT
DRSG EMULSION OIL 3X3 NADH (GAUZE/BANDAGES/DRESSINGS) ×1 IMPLANT
DRSG MEPITEL 4X7.2 (GAUZE/BANDAGES/DRESSINGS) ×1 IMPLANT
DRSG VAC ATS SM SENSATRAC (GAUZE/BANDAGES/DRESSINGS) ×1 IMPLANT
ELECT CAUTERY BLADE 6.4 (BLADE) IMPLANT
ELECT REM PT RETURN 9FT ADLT (ELECTROSURGICAL)
ELECTRODE REM PT RTRN 9FT ADLT (ELECTROSURGICAL) IMPLANT
GAUZE SPONGE 4X4 12PLY STRL (GAUZE/BANDAGES/DRESSINGS) ×2 IMPLANT
GLOVE BIO SURGEON STRL SZ7.5 (GLOVE) ×2 IMPLANT
GLOVE BIO SURGEON STRL SZ8 (GLOVE) ×2 IMPLANT
GLOVE BIOGEL PI IND STRL 7.5 (GLOVE) ×1 IMPLANT
GLOVE BIOGEL PI IND STRL 8 (GLOVE) ×1 IMPLANT
GLOVE BIOGEL PI INDICATOR 7.5 (GLOVE) ×1
GLOVE BIOGEL PI INDICATOR 8 (GLOVE) ×1
GOWN STRL REUS W/ TWL LRG LVL3 (GOWN DISPOSABLE) ×2 IMPLANT
GOWN STRL REUS W/ TWL XL LVL3 (GOWN DISPOSABLE) ×1 IMPLANT
GOWN STRL REUS W/TWL LRG LVL3 (GOWN DISPOSABLE) ×4
GOWN STRL REUS W/TWL XL LVL3 (GOWN DISPOSABLE) ×2
HANDPIECE INTERPULSE COAX TIP (DISPOSABLE)
KIT BASIN OR (CUSTOM PROCEDURE TRAY) ×2 IMPLANT
KIT ROOM TURNOVER OR (KITS) ×2 IMPLANT
MANIFOLD NEPTUNE II (INSTRUMENTS) ×2 IMPLANT
NS IRRIG 1000ML POUR BTL (IV SOLUTION) ×2 IMPLANT
PACK ORTHO EXTREMITY (CUSTOM PROCEDURE TRAY) ×2 IMPLANT
PAD ARMBOARD 7.5X6 YLW CONV (MISCELLANEOUS) ×4 IMPLANT
PADDING CAST COTTON 6X4 STRL (CAST SUPPLIES) ×2 IMPLANT
SET HNDPC FAN SPRY TIP SCT (DISPOSABLE) IMPLANT
SPONGE LAP 18X18 X RAY DECT (DISPOSABLE) ×2 IMPLANT
STOCKINETTE IMPERVIOUS 9X36 MD (GAUZE/BANDAGES/DRESSINGS) ×1 IMPLANT
SUT PDS AB 2-0 CT1 27 (SUTURE) IMPLANT
TAPE SURG TRANSPORE 1 IN (GAUZE/BANDAGES/DRESSINGS) IMPLANT
TAPE SURGICAL TRANSPORE 1 IN (GAUZE/BANDAGES/DRESSINGS) ×1
TOWEL OR 17X24 6PK STRL BLUE (TOWEL DISPOSABLE) ×2 IMPLANT
TOWEL OR 17X26 10 PK STRL BLUE (TOWEL DISPOSABLE) ×4 IMPLANT
TUBE ANAEROBIC SPECIMEN COL (MISCELLANEOUS) IMPLANT
TUBE CONNECTING 12X1/4 (SUCTIONS) ×2 IMPLANT
TUBING CYSTO DISP (UROLOGICAL SUPPLIES) ×1 IMPLANT
UNDERPAD 30X30 INCONTINENT (UNDERPADS AND DIAPERS) ×2 IMPLANT
WATER STERILE IRR 1000ML POUR (IV SOLUTION) ×2 IMPLANT
YANKAUER SUCT BULB TIP NO VENT (SUCTIONS) ×2 IMPLANT
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
## 2015-04-28 NOTE — Progress Notes (Signed)
Orthopedic Tech Progress Note Patient Details:  Mathew Norman [birth year] [id number] Patient did not need OHF at this time. May want to reconsider after sx if another frame is order by MD.   Patient ID: Mathew Norman, male   DOB: [birth year], 19 y.o.   MRN: [id number]   Mathew Norman 04/28/2015, 12:52 AM

## 2015-04-28 NOTE — Anesthesia Procedure Notes (Signed)
Procedure Name: Intubation Date/Time: 04/28/2015 3:11 PM Performed by: Daiva Eves Pre-anesthesia Checklist: Patient identified, Emergency Drugs available, Suction available, Timeout performed and Patient being monitored Patient Re-evaluated:Patient Re-evaluated prior to inductionOxygen Delivery Method: Circle system utilized Preoxygenation: Pre-oxygenation with 100% oxygen Intubation Type: IV induction Laryngoscope Size: Mac and 4 Grade View: Grade I Tube type: Oral Tube size: 7.5 mm Number of attempts: 1 Airway Equipment and Method: Stylet Placement Confirmation: ETT inserted through vocal cords under direct vision,  positive ETCO2,  CO2 detector and breath sounds checked- equal and bilateral Secured at: 22 cm Tube secured with: Tape Dental Injury: Teeth and Oropharynx as per pre-operative assessment

## 2015-04-28 NOTE — Brief Op Note (Signed)
04/24/2015 - 04/28/2015  2:47 PM  PATIENT:  Mathew Norman  19 y.o. male  PRE-OPERATIVE DIAGNOSIS:  LEFT THIGH WOUND  POST-OPERATIVE DIAGNOSIS:  LEFT THIGH WOUND, 10.5cm  PROCEDURE:  Procedure(s): DEBRIDEMENT AND CLOSURE WOUND LEFT THIGH  (Left)  SURGEON:  Surgeon(s) and Role:    * Myrene Galas, MD - Primary  PHYSICIAN ASSISTANT: [name], PA-C  ANESTHESIA:   general  I/O:  Total I/O In: -  Out: 700 [Urine:700]  SPECIMEN:  No Specimen  TOURNIQUET:  * No tourniquets in log *  DICTATION: .Other Dictation: Dictation Number [id number]
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
## 2015-04-28 NOTE — Progress Notes (Signed)
I discussed with the patient the risks and benefits of surgery for repeat debridement and probable closure of the left thigh wound, including the possibility of infection, nerve injury, vessel injury, wound breakdown, arthritis, DVT/ PE, loss of motion, and need for further surgery among others.  He understood these risks and wished to proceed.  Myrene Galas, MD [facility] [phone] (p)
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
## 2015-04-28 NOTE — Progress Notes (Signed)
Rehab Admissions Coordinator Note:  Patient was screened by Clois Dupes for appropriateness for an Inpatient Acute Rehab Consult per PT and OT recommendation.  At this time, we are recommending Inpatient Rehab consult. I will contact Trauma PA for orders.  Clois Dupes 04/28/2015, 9:13 AM  I can be reached at [phone].
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
## 2015-04-28 NOTE — Anesthesia Postprocedure Evaluation (Signed)
  Anesthesia Post-op Note  Patient: Mathew Norman  Procedure(s) Performed: Procedure(s): DEBRIDEMENT AND CLOSURE WOUND LEFT THIGH  (Left)  Patient Location: PACU  Anesthesia Type:General  Level of Consciousness: awake  Airway and Oxygen Therapy: Patient Spontanous Breathing  Post-op Pain: mild  Post-op Assessment: Post-op Vital signs reviewed              Post-op Vital Signs: Reviewed  Last Vitals:  Filed Vitals:   04/28/15 1600  BP: 156/76  Pulse: 112  Temp: 38 C  Resp: 18    Complications: No apparent anesthesia complications

## 2015-04-28 NOTE — Transfer of Care (Signed)
Immediate Anesthesia Transfer of Care Note  Patient: Mathew Norman  Procedure(s) Performed: Procedure(s): DEBRIDEMENT AND CLOSURE WOUND LEFT THIGH  (Left)  Patient Location: PACU  Anesthesia Type:General  Level of Consciousness: oriented, sedated and patient cooperative  Airway & Oxygen Therapy: Patient Spontanous Breathing and Patient connected to nasal cannula oxygen  Post-op Assessment: Report given to RN and Post -op Vital signs reviewed and stable  Post vital signs: Reviewed and stable  Last Vitals:  Filed Vitals:   04/28/15 1318  BP: 145/78  Pulse: 106  Temp: 37.4 C  Resp: 18    Complications: No apparent anesthesia complications

## 2015-04-29 ENCOUNTER — Encounter (HOSPITAL_COMMUNITY): Payer: Self-pay | Admitting: Orthopedic Surgery

## 2015-04-29 DIAGNOSIS — S71112A Laceration without foreign body, left thigh, initial encounter: Secondary | ICD-10-CM | POA: Diagnosis present

## 2015-04-29 DIAGNOSIS — S71112S Laceration without foreign body, left thigh, sequela: Secondary | ICD-10-CM

## 2015-04-29 DIAGNOSIS — S32402S Unspecified fracture of left acetabulum, sequela: Secondary | ICD-10-CM

## 2015-04-29 DIAGNOSIS — S51011A Laceration without foreign body of right elbow, initial encounter: Secondary | ICD-10-CM | POA: Diagnosis present

## 2015-04-29 DIAGNOSIS — R7303 Prediabetes: Secondary | ICD-10-CM | POA: Diagnosis present

## 2015-04-29 DIAGNOSIS — E669 Obesity, unspecified: Secondary | ICD-10-CM | POA: Diagnosis present

## 2015-04-29 LAB — HEMOGLOBIN A1C
HEMOGLOBIN A1C: 6.1 % — AB (ref 4.8–5.6)
MEAN PLASMA GLUCOSE: 128 mg/dL

## 2015-04-29 LAB — RAPID HIV SCREEN (HIV 1/2 AB+AG)
HIV 1/2 Antibodies: NONREACTIVE
HIV-1 P24 Antigen - HIV24: NONREACTIVE

## 2015-04-29 MED ORDER — OXYCODONE HCL ER 10 MG PO T12A
10.0000 mg | EXTENDED_RELEASE_TABLET | Freq: Two times a day (BID) | ORAL | Status: DC
  Administered 2015-04-29 – 2015-05-01 (×5): 10 mg via ORAL
  Filled 2015-04-29 (×5): qty 1

## 2015-04-29 MED ORDER — ENOXAPARIN SODIUM 80 MG/0.8ML ~~LOC~~ SOLN
70.0000 mg | Freq: Two times a day (BID) | SUBCUTANEOUS | Status: DC
  Administered 2015-04-29 – 2015-05-01 (×5): 70 mg via SUBCUTANEOUS
  Filled 2015-04-29 (×9): qty 0.8

## 2015-04-29 MED ORDER — HYDROMORPHONE HCL 1 MG/ML IJ SOLN
0.5000 mg | INTRAMUSCULAR | Status: DC | PRN
  Administered 2015-04-29: 0.5 mg via INTRAVENOUS
  Filled 2015-04-29: qty 1
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
## 2015-04-29 NOTE — Progress Notes (Signed)
PT Cancellation Note  Patient Details Name: DREWEY BEGUE MRN: [id number] DOB: 1996/06/12   Cancelled Treatment:    Reason Eval/Treat Not Completed: Other (comment). Patient requesting male therapist to see him. Will attempt to schedule this this afternoon, if not will follow up in AM.    Robinette, Adline Potter 04/29/2015, 1:23 PM

## 2015-04-29 NOTE — Progress Notes (Signed)
I met with pt at bedside to discuss an inpt rehab admission pending insurance approval and bed availability. He is in agreement. I will follow up tomorrow. [phone]
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
## 2015-04-30 LAB — HEPATITIS PANEL, ACUTE
HCV Ab: <0.1 s/co ratio (ref 0.0–0.9)
HEP A IGM: NEGATIVE
HEP B C IGM: NEGATIVE
Hepatitis B Surface Ag: NEGATIVE

## 2015-04-30 MED ORDER — WARFARIN SODIUM 5 MG PO TABS
10.0000 mg | ORAL_TABLET | Freq: Once | ORAL | Status: AC
  Administered 2015-04-30: 10 mg via ORAL
  Filled 2015-04-30: qty 2

## 2015-04-30 MED ORDER — WARFARIN - PHARMACIST DOSING INPATIENT
Freq: Every day | Status: DC
  Administered 2015-04-30: 17:00:00

## 2015-04-30 MED ORDER — WARFARIN VIDEO: Freq: Once | Status: DC

## 2015-04-30 MED ORDER — COUMADIN BOOK
Freq: Once | Status: DC
  Filled 2015-04-30 (×2): qty 1
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
## 2015-04-30 NOTE — Progress Notes (Signed)
I met with pt in his Dad's hospital room as he was visiting in recliner accompanied by his sister. I await insurance approval and bed availability on inpt rehab to admit pt hopefully Thursday. [phone]
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
## 2015-04-30 NOTE — Discharge Instructions (Signed)
Information on my medicine - Coumadin®   (Warfarin) ° °This medication education was reviewed with me or my healthcare representative as part of my discharge preparation.   ° °Why was Coumadin prescribed for you? °Coumadin was prescribed for you because you have a blood clot or a medical condition that can cause an increased risk of forming blood clots. Blood clots can cause serious health problems by blocking the flow of blood to the heart, lung, or brain. Coumadin can prevent harmful blood clots from forming. °As a reminder your indication for Coumadin is:   Blood Clot Prevention After Orthopedic Surgery ° °What test will check on my response to Coumadin? °While on Coumadin (warfarin) you will need to have an INR test regularly to ensure that your dose is keeping you in the desired range. The INR (international normalized ratio) number is calculated from the result of the laboratory test called prothrombin time (PT). ° °If an INR APPOINTMENT HAS NOT ALREADY BEEN MADE FOR YOU please schedule an appointment to have this lab work done by your health care provider within 7 days. °Your INR goal is usually a number between:  2 to 3 or your provider may give you a more narrow range like 2-2.5.  Ask your health care provider during an office visit what your goal INR is. ° °What  do you need to  know  About  COUMADIN? °Take Coumadin (warfarin) exactly as prescribed by your healthcare provider about the same time each day.  DO NOT stop taking without talking to the doctor who prescribed the medication.  Stopping without other blood clot prevention medication to take the place of Coumadin may increase your risk of developing a new clot or stroke.  Get refills before you run out. ° °What do you do if you miss a dose? °If you miss a dose, take it as soon as you remember on the same day then continue your regularly scheduled regimen the next day.  Do not take two doses of Coumadin at the same time. ° °Important Safety  Information °A possible side effect of Coumadin (Warfarin) is an increased risk of bleeding. You should call your healthcare provider right away if you experience any of the following: °  Bleeding from an injury or your nose that does not stop. °  Unusual colored urine (red or dark brown) or unusual colored stools (red or black). °  Unusual bruising for unknown reasons. °  A serious fall or if you hit your head (even if there is no bleeding). ° °Some foods or medicines interact with Coumadin® (warfarin) and might alter your response to warfarin. To help avoid this: °  Eat a balanced diet, maintaining a consistent amount of Vitamin K. °  Notify your provider about major diet changes you plan to make. °  Avoid alcohol or limit your intake to 1 drink for women and 2 drinks for men per day. °(1 drink is 5 oz. wine, 12 oz. beer, or 1.5 oz. liquor.) ° °Make sure that ANY health care provider who prescribes medication for you knows that you are taking Coumadin (warfarin).  Also make sure the healthcare provider who is monitoring your Coumadin knows when you have started a new medication including herbals and non-prescription products. ° °Coumadin® (Warfarin)  Major Drug Interactions  °Increased Warfarin Effect Decreased Warfarin Effect  °Alcohol (large quantities) °Antibiotics (esp. Septra/Bactrim, Flagyl, Cipro) °Amiodarone (Cordarone) °Aspirin (ASA) °Cimetidine (Tagamet) °Megestrol (Megace) °NSAIDs (ibuprofen, naproxen, etc.) °Piroxicam (Feldene) °Propafenone (Rythmol SR) °Propranolol (  Inderal) °Isoniazid (INH) °Posaconazole (Noxafil) Barbiturates (Phenobarbital) °Carbamazepine (Tegretol) °Chlordiazepoxide (Librium) °Cholestyramine (Questran) °Griseofulvin °Oral Contraceptives °Rifampin °Sucralfate (Carafate) °Vitamin K  ° °Coumadin® (Warfarin) Major Herbal Interactions  °Increased Warfarin Effect Decreased Warfarin Effect  °Garlic °Ginseng °Ginkgo biloba Coenzyme Q10 °Green tea °St. John’s wort   ° °Coumadin® (Warfarin)  FOOD Interactions  °Eat a consistent number of servings per week of foods HIGH in Vitamin K °(1 serving = ½ cup)  °Collards (cooked, or boiled & drained) °Kale (cooked, or boiled & drained) °Mustard greens (cooked, or boiled & drained) °Parsley *serving size only = ¼ cup °Spinach (cooked, or boiled & drained) °Swiss chard (cooked, or boiled & drained) °Turnip greens (cooked, or boiled & drained)  °Eat a consistent number of servings per week of foods MEDIUM-HIGH in Vitamin K °(1 serving = 1 cup)  °Asparagus (cooked, or boiled & drained) °Broccoli (cooked, boiled & drained, or raw & chopped) °Brussel sprouts (cooked, or boiled & drained) *serving size only = ½ cup °Lettuce, raw (green leaf, endive, romaine) °Spinach, raw °Turnip greens, raw & chopped  ° °These websites have more information on Coumadin (warfarin):  www.coumadin.com; °www.ahrq.gov/consumer/coumadin.htm; ° ° ° °
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
## 2015-05-01 ENCOUNTER — Inpatient Hospital Stay (HOSPITAL_COMMUNITY)
Admission: RE | Admit: 2015-05-01 | Discharge: 2015-05-08 | DRG: 560 | Disposition: A | Discharge status: Home Health | Payer: Commercial Managed Care - HMO | Source: Intra-hospital | Attending: Physical Medicine & Rehabilitation | Admitting: Physical Medicine & Rehabilitation

## 2015-05-01 DIAGNOSIS — R000 Tachycardia, unspecified: Secondary | ICD-10-CM | POA: Diagnosis present

## 2015-05-01 DIAGNOSIS — Z6841 Body Mass Index (BMI) 40.0 and over, adult: Secondary | ICD-10-CM | POA: Diagnosis not present

## 2015-05-01 DIAGNOSIS — S71112A Laceration without foreign body, left thigh, initial encounter: Secondary | ICD-10-CM | POA: Diagnosis present

## 2015-05-01 DIAGNOSIS — R7309 Other abnormal glucose: Secondary | ICD-10-CM | POA: Diagnosis not present

## 2015-05-01 DIAGNOSIS — S41121D Laceration with foreign body of right upper arm, subsequent encounter: Secondary | ICD-10-CM | POA: Diagnosis not present

## 2015-05-01 DIAGNOSIS — E669 Obesity, unspecified: Secondary | ICD-10-CM | POA: Diagnosis present

## 2015-05-01 DIAGNOSIS — R509 Fever, unspecified: Secondary | ICD-10-CM | POA: Diagnosis present

## 2015-05-01 DIAGNOSIS — D62 Acute posthemorrhagic anemia: Secondary | ICD-10-CM | POA: Diagnosis present

## 2015-05-01 DIAGNOSIS — K5900 Constipation, unspecified: Secondary | ICD-10-CM | POA: Diagnosis present

## 2015-05-01 DIAGNOSIS — S32402D Unspecified fracture of left acetabulum, subsequent encounter for fracture with routine healing: Secondary | ICD-10-CM | POA: Diagnosis not present

## 2015-05-01 DIAGNOSIS — S32452D Displaced transverse fracture of left acetabulum, subsequent encounter for fracture with routine healing: Secondary | ICD-10-CM | POA: Diagnosis not present

## 2015-05-01 DIAGNOSIS — E871 Hypo-osmolality and hyponatremia: Secondary | ICD-10-CM | POA: Diagnosis present

## 2015-05-01 DIAGNOSIS — R609 Edema, unspecified: Secondary | ICD-10-CM | POA: Diagnosis not present

## 2015-05-01 DIAGNOSIS — S51011A Laceration without foreign body of right elbow, initial encounter: Secondary | ICD-10-CM | POA: Diagnosis present

## 2015-05-01 DIAGNOSIS — S32810S Multiple fractures of pelvis with stable disruption of pelvic ring, sequela: Secondary | ICD-10-CM | POA: Diagnosis not present

## 2015-05-01 DIAGNOSIS — S71112S Laceration without foreign body, left thigh, sequela: Secondary | ICD-10-CM | POA: Diagnosis not present

## 2015-05-01 DIAGNOSIS — R739 Hyperglycemia, unspecified: Secondary | ICD-10-CM | POA: Diagnosis present

## 2015-05-01 DIAGNOSIS — S32402A Unspecified fracture of left acetabulum, initial encounter for closed fracture: Secondary | ICD-10-CM | POA: Diagnosis present

## 2015-05-01 DIAGNOSIS — S32402S Unspecified fracture of left acetabulum, sequela: Secondary | ICD-10-CM | POA: Diagnosis not present

## 2015-05-01 DIAGNOSIS — S71112D Laceration without foreign body, left thigh, subsequent encounter: Secondary | ICD-10-CM | POA: Diagnosis not present

## 2015-05-01 DIAGNOSIS — S51011S Laceration without foreign body of right elbow, sequela: Secondary | ICD-10-CM | POA: Diagnosis not present

## 2015-05-01 DIAGNOSIS — S32810D Multiple fractures of pelvis with stable disruption of pelvic ring, subsequent encounter for fracture with routine healing: Secondary | ICD-10-CM | POA: Diagnosis not present

## 2015-05-01 DIAGNOSIS — S32810A Multiple fractures of pelvis with stable disruption of pelvic ring, initial encounter for closed fracture: Secondary | ICD-10-CM

## 2015-05-01 LAB — URINALYSIS, ROUTINE W REFLEX MICROSCOPIC
Bilirubin Urine: NEGATIVE
Glucose, UA: NEGATIVE mg/dL
Hgb urine dipstick: NEGATIVE
Ketones, ur: NEGATIVE mg/dL
LEUKOCYTES UA: NEGATIVE
NITRITE: NEGATIVE
PH: 7 (ref 5.0–8.0)
Protein, ur: NEGATIVE mg/dL
SPECIFIC GRAVITY, URINE: 1.023 (ref 1.005–1.030)
Urobilinogen, UA: 2 mg/dL — ABNORMAL HIGH (ref 0.0–1.0)

## 2015-05-01 LAB — PROTIME-INR
INR: 1.36 (ref 0.00–1.49)
INR: 1.41 (ref 0.00–1.49)
PROTHROMBIN TIME: 16.9 s — AB (ref 11.6–15.2)
PROTHROMBIN TIME: 17.3 s — AB (ref 11.6–15.2)

## 2015-05-01 MED ORDER — BISACODYL 10 MG RE SUPP
10.0000 mg | Freq: Every day | RECTAL | Status: DC | PRN
Start: 2015-05-01 — End: 2015-05-08

## 2015-05-01 MED ORDER — ENOXAPARIN SODIUM 40 MG/0.4ML ~~LOC~~ SOLN: 40.0000 mg | Freq: Two times a day (BID) | SUBCUTANEOUS | Status: DC

## 2015-05-01 MED ORDER — GUAIFENESIN-DM 100-10 MG/5ML PO SYRP: 5.0000 mL | ORAL_SOLUTION | Freq: Four times a day (QID) | ORAL | Status: DC | PRN

## 2015-05-01 MED ORDER — ACETAMINOPHEN 325 MG PO TABS
325.0000 mg | ORAL_TABLET | ORAL | Status: DC | PRN
Start: 2015-05-01 — End: 2015-05-08

## 2015-05-01 MED ORDER — ENOXAPARIN SODIUM 80 MG/0.8ML ~~LOC~~ SOLN: 70.0000 mg | Freq: Two times a day (BID) | SUBCUTANEOUS | Status: DC

## 2015-05-01 MED ORDER — WARFARIN SODIUM 5 MG PO TABS
10.0000 mg | ORAL_TABLET | Freq: Once | ORAL | Status: AC
  Administered 2015-05-01: 10 mg via ORAL
  Filled 2015-05-01: qty 2

## 2015-05-01 MED ORDER — FLEET ENEMA 7-19 GM/118ML RE ENEM: 1.0000 | ENEMA | Freq: Once | RECTAL | Status: DC | PRN

## 2015-05-01 MED ORDER — DIPHENHYDRAMINE HCL 25 MG PO CAPS: 25.0000 mg | ORAL_CAPSULE | Freq: Four times a day (QID) | ORAL | Status: DC | PRN

## 2015-05-01 MED ORDER — PROCHLORPERAZINE MALEATE 5 MG PO TABS: 5.0000 mg | ORAL_TABLET | Freq: Four times a day (QID) | ORAL | Status: DC | PRN

## 2015-05-01 MED ORDER — OXYCODONE HCL ER 20 MG PO T12A
20.0000 mg | EXTENDED_RELEASE_TABLET | Freq: Two times a day (BID) | ORAL | Status: DC
  Administered 2015-05-01 – 2015-05-05 (×9): 20 mg via ORAL
  Filled 2015-05-01 (×10): qty 1

## 2015-05-01 MED ORDER — DOCUSATE SODIUM 100 MG PO CAPS: 200.0000 mg | ORAL_CAPSULE | Freq: Two times a day (BID) | ORAL | Status: DC

## 2015-05-01 MED ORDER — PROCHLORPERAZINE 25 MG RE SUPP: 12.5000 mg | Freq: Four times a day (QID) | RECTAL | Status: DC | PRN

## 2015-05-01 MED ORDER — WARFARIN SODIUM 5 MG PO TABS: 10.0000 mg | ORAL_TABLET | Freq: Once | ORAL | Status: DC

## 2015-05-01 MED ORDER — SENNOSIDES-DOCUSATE SODIUM 8.6-50 MG PO TABS
2.0000 | ORAL_TABLET | Freq: Every day | ORAL | Status: DC
  Administered 2015-05-01 – 2015-05-06 (×6): 2 via ORAL
  Filled 2015-05-01 (×7): qty 2

## 2015-05-01 MED ORDER — PROCHLORPERAZINE EDISYLATE 5 MG/ML IJ SOLN: 5.0000 mg | Freq: Four times a day (QID) | INTRAMUSCULAR | Status: DC | PRN

## 2015-05-01 MED ORDER — ENOXAPARIN SODIUM 60 MG/0.6ML ~~LOC~~ SOLN
60.0000 mg | SUBCUTANEOUS | Status: DC
  Administered 2015-05-02 – 2015-05-07 (×6): 60 mg via SUBCUTANEOUS
  Filled 2015-05-01 (×8): qty 0.6

## 2015-05-01 MED ORDER — TRAZODONE HCL 50 MG PO TABS: 25.0000 mg | ORAL_TABLET | Freq: Every evening | ORAL | Status: DC | PRN

## 2015-05-01 MED ORDER — TRAMADOL HCL 50 MG PO TABS
100.0000 mg | ORAL_TABLET | Freq: Four times a day (QID) | ORAL | Status: DC
  Administered 2015-05-01 – 2015-05-08 (×29): 100 mg via ORAL
  Filled 2015-05-01 (×30): qty 2

## 2015-05-01 MED ORDER — OXYCODONE HCL 5 MG PO TABS
20.0000 mg | ORAL_TABLET | ORAL | Status: DC | PRN
  Administered 2015-05-02 – 2015-05-04 (×4): 20 mg via ORAL
  Filled 2015-05-01 (×5): qty 4

## 2015-05-01 MED ORDER — ALUM & MAG HYDROXIDE-SIMETH 200-200-20 MG/5ML PO SUSP
30.0000 mL | ORAL | Status: DC | PRN
Start: 2015-05-01 — End: 2015-05-08

## 2015-05-01 MED ORDER — METHOCARBAMOL 500 MG PO TABS
750.0000 mg | ORAL_TABLET | Freq: Three times a day (TID) | ORAL | Status: DC
  Administered 2015-05-01 – 2015-05-08 (×21): 750 mg via ORAL
  Filled 2015-05-01 (×21): qty 2

## 2015-05-01 MED ORDER — WARFARIN - PHYSICIAN DOSING INPATIENT: Freq: Every day | Status: DC
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
## 2015-05-01 NOTE — Care Management Note (Signed)
Case Management Note  Patient Details  Name: Mathew Norman MRN: [id number] Date of Birth: [birth year]  Subjective/Objective:    Pt is medically stable and has been accepted for admission to inpatient rehab today.                  Action/Plan: Will dc to CIR later today.    Expected Discharge Date:    05/01/15              Expected Discharge Plan:  IP Rehab Facility  In-House Referral:     Discharge planning Services  CM Consult  Post Acute Care Choice:    Choice offered to:     DME Arranged:    DME Agency:     HH Arranged:    HH Agency:     Status of Service:  Completed, signed off  Medicare Important Message Given:    Date Medicare IM Given:    Medicare IM give by:    Date Additional Medicare IM Given:    Additional Medicare Important Message give by:     If discussed at Long Length of Stay Meetings, dates discussed:    Additional Comments:  Quintella Baton, RN, BSN  Trauma/Neuro ICU Case Manager [phone]
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
## 2015-05-01 NOTE — Clinical Social Work Note (Signed)
Clinical Social Worker following patient and family for potential discharge planning needs.  Patient has been accepted to inpatient rehab and plans to transfer today.  Inpatient rehab admissions coordinator to notify patient and family.  Clinical Social Worker will sign off for now as social work intervention is no longer needed. Please consult [place] again if new need arises.  Macario Golds, [place] [phone]
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
## 2015-05-02 ENCOUNTER — Inpatient Hospital Stay (HOSPITAL_COMMUNITY): Payer: Commercial Managed Care - HMO | Admitting: Physical Therapy

## 2015-05-02 ENCOUNTER — Inpatient Hospital Stay (HOSPITAL_COMMUNITY): Payer: Commercial Managed Care - HMO | Admitting: Occupational Therapy

## 2015-05-02 ENCOUNTER — Encounter (HOSPITAL_COMMUNITY): Payer: Self-pay

## 2015-05-02 ENCOUNTER — Inpatient Hospital Stay (HOSPITAL_COMMUNITY): Payer: Commercial Managed Care - HMO

## 2015-05-02 DIAGNOSIS — S32402D Unspecified fracture of left acetabulum, subsequent encounter for fracture with routine healing: Secondary | ICD-10-CM

## 2015-05-02 DIAGNOSIS — S32810D Multiple fractures of pelvis with stable disruption of pelvic ring, subsequent encounter for fracture with routine healing: Principal | ICD-10-CM

## 2015-05-02 DIAGNOSIS — R609 Edema, unspecified: Secondary | ICD-10-CM

## 2015-05-02 LAB — PROTIME-INR
INR: 1.9 — ABNORMAL HIGH (ref 0.00–1.49)
PROTHROMBIN TIME: 21.7 s — AB (ref 11.6–15.2)

## 2015-05-02 MED ORDER — WARFARIN SODIUM 7.5 MG PO TABS
7.5000 mg | ORAL_TABLET | Freq: Once | ORAL | Status: AC
  Administered 2015-05-02: 7.5 mg via ORAL
  Filled 2015-05-02: qty 1

## 2015-05-02 MED ORDER — ENSURE ENLIVE PO LIQD
237.0000 mL | Freq: Two times a day (BID) | ORAL | Status: DC
  Administered 2015-05-03 – 2015-05-08 (×11): 237 mL via ORAL

## 2015-05-02 NOTE — IPOC Note (Signed)
Overall Plan of Care [facility]) Patient Details Name: Mathew Norman MRN: [id number] DOB: April 07, 1996  Admitting Diagnosis: Pelvic ring fx left acet fx   Hospital Problems: Active Problems:   Pelvic ring fracture     Functional Problem List: Nursing Bowel, Endurance, Medication Management, Nutrition, Pain, Safety, Skin Integrity  PT Pain, Balance, Safety, Endurance  OT Balance, Endurance, Pain  SLP    TR         Basic ADL's: OT Grooming, Bathing, Dressing, Toileting     Advanced  ADL's: OT Simple Meal Preparation     Transfers: PT Bed Mobility, Bed to Chair, Car, Occupational psychologist, Research scientist (life sciences): PT Psychologist, prison and probation services, Ambulation, Stairs     Additional Impairments: OT    SLP        TR      Anticipated Outcomes Item Anticipated Outcome  Self Feeding independent  Swallowing      Basic self-care  supervision  Toileting  supervision   Bathroom Transfers    Bowel/Bladder  continent of bowel and bladder, no LBM 8/11, uses urinal  Transfers  supervision transfers  Locomotion  mod I w/c mobility  Communication     Cognition     Pain  pain less than or equal to 4 on a scale of 0-10  Safety/Judgment  no new injury or skin breakdown, will make appropriate safety decisions   Therapy Plan: PT Intensity: Minimum of 1-2 x/day ,45 to 90 minutes PT Frequency: 5 out of 7 days PT Duration Estimated Length of Stay: 7-10 days OT Intensity: Minimum of 1-2 x/day, 45 to 90 minutes OT Frequency: 5 out of 7 days OT Duration/Estimated Length of Stay: 7-9 days         Team Interventions: Nursing Interventions Bowel Management, Pain Management, Medication Management, Skin Care/Wound Management, Discharge Planning, Psychosocial Support, Patient/Family Education  PT interventions Ambulation/gait training, Therapeutic Exercise, UE/LE Strength taining/ROM, Therapeutic Activities, DME/adaptive equipment instruction, Functional mobility training,  Warden/ranger, Wheelchair propulsion/positioning, UE/LE Coordination activities, Patient/family education, Museum/gallery curator  OT Interventions Warden/ranger, DME/adaptive equipment instruction, Discharge planning, Functional mobility training, Therapeutic Activities, UE/LE Coordination activities, Patient/family education, UE/LE Strength taining/ROM, Therapeutic Exercise, Self Care/advanced ADL retraining, Pain management, Community reintegration  SLP Interventions    TR Interventions    SW/CM Interventions Discharge Planning, Facilities manager, Patient/Family Education    Team Discharge Planning: Destination: PT-Home ,OT- Home , SLP-  Projected Follow-up: PT-Outpatient PT, OT-  None, SLP-  Projected Equipment Needs: PT-Wheelchair cushion (measurements), Wheelchair (measurements) (22x18, elevating LLE leg rest, standard RW), OT- 3 in 1 bedside comode, Tub/shower bench, SLP-  Equipment Details: PT- , OT-  Patient/family involved in discharge planning: PT- Patient,  OT-Patient, SLP-   MD ELOS: 7-10 days Medical Rehab Prognosis:  Excellent Assessment: The patient has been admitted for CIR therapies with the diagnosis of pelvic ring/acetabular fx. The team will be addressing functional mobility, strength, stamina, balance, safety, adaptive techniques and equipment, self-care, bowel and bladder mgt, patient and caregiver education, pain mgt, ortho precautions, ego support, community reintegration. Goals have been set at supervision for self-care and transfers and mod I w/c mobility.    Ranelle Oyster, MD, FAAPMR      See Team Conference Notes for weekly updates to the plan of care
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
## 2015-05-02 NOTE — Discharge Instructions (Addendum)
Inpatient Rehab Discharge Instructions  Mathew Norman Discharge date and time: 05/08/15   Activities/Precautions/ Functional Status: Activity: Touch down weight on right leg for transfers only.  Diet: regular diet Wound Care: Wash with soap and water. Pat dry. Keep wound clean and dry--no lotions ointment or creams. Contact Dr. Carola Frost if you develop any redness, swelling, increase in pain or fever/chills.   Functional status:  ___ No restrictions     ___ Walk up steps independently ___ 24/7 supervision/assistance   ___ Walk up steps with assistance _X__ Intermittent supervision/assistance  _X__ Bathe/dress independently ___ Walk with walker     ___ Bathe/dress with assistance ___ Walk Independently    ___ Shower independently ___ Walk with assistance    ___ Shower with assistance _X__ No alcohol     ___ Return to work/school ________  Special Instructions:   COMMUNITY REFERRALS UPON DISCHARGE:    Home Health:   PT, OT, RN   Agency:[facility] Phone:[phone]   Date of last service:05/08/2015  Medical Equipment/Items Ordered:WHEELCHAIR, WIDE Levan Hurst,  BEDSIDE COMMODE  Agency/Supplier:[facility]    [phone]    My questions have been answered and I understand these instructions. I will adhere to these goals and the provided educational materials after my discharge from the hospital.  Patient/Caregiver Signature _______________________________ Date __________  Clinician Signature _______________________________________ Date __________  Please bring this form and your medication list with you to all your follow-up doctor's appointments. Information on my medicine - Coumadin   (Warfarin)  This medication education was reviewed with me or my healthcare representative as part of my discharge preparation.    Why was Coumadin prescribed for you? Coumadin was prescribed for you because you have a blood clot or a medical condition that can  cause an increased risk of forming blood clots. Blood clots can cause serious health problems by blocking the flow of blood to the heart, lung, or brain. Coumadin can prevent harmful blood clots from forming. As a reminder your indication for Coumadin is:   Blood Clot Prevention After Orthopedic Surgery  What test will check on my response to Coumadin? While on Coumadin (warfarin) you will need to have an INR test regularly to ensure that your dose is keeping you in the desired range. The INR (international normalized ratio) number is calculated from the result of the laboratory test called prothrombin time (PT).  If an INR APPOINTMENT HAS NOT ALREADY BEEN MADE FOR YOU please schedule an appointment to have this lab work done by your health care provider within 7 days. Your INR goal is usually a number between:  2 to 3 or your provider may give you a more narrow range like 2-2.5.  Ask your health care provider during an office visit what your goal INR is.  What  do you need to  know  About  COUMADIN? Take Coumadin (warfarin) exactly as prescribed by your healthcare provider about the same time each day.  DO NOT stop taking without talking to the doctor who prescribed the medication.  Stopping without other blood clot prevention medication to take the place of Coumadin may increase your risk of developing a new clot or stroke.  Get refills before you run out.  What do you do if you miss a dose? If you miss a dose, take it as soon as you remember on the same day then continue your regularly scheduled regimen the next day.  Do not take two doses of Coumadin at  the same time.  Important Safety Information A possible side effect of Coumadin (Warfarin) is an increased risk of bleeding. You should call your healthcare provider right away if you experience any of the following: ? Bleeding from an injury or your nose that does not stop. ? Unusual colored urine (red or dark brown) or unusual colored stools  (red or black). ? Unusual bruising for unknown reasons. ? A serious fall or if you hit your head (even if there is no bleeding).  Some foods or medicines interact with Coumadin (warfarin) and might alter your response to warfarin. To help avoid this: ? Eat a balanced diet, maintaining a consistent amount of Vitamin K. ? Notify your provider about major diet changes you plan to make. ? Avoid alcohol or limit your intake to 1 drink for women and 2 drinks for men per day. (1 drink is 5 oz. wine, 12 oz. beer, or 1.5 oz. liquor.)  Make sure that ANY health care provider who prescribes medication for you knows that you are taking Coumadin (warfarin).  Also make sure the healthcare provider who is monitoring your Coumadin knows when you have started a new medication including herbals and non-prescription products.  Coumadin (Warfarin)  Major Drug Interactions  Increased Warfarin Effect Decreased Warfarin Effect  Alcohol (large quantities) Antibiotics (esp. Septra/Bactrim, Flagyl, Cipro) Amiodarone (Cordarone) Aspirin (ASA) Cimetidine (Tagamet) Megestrol (Megace) NSAIDs (ibuprofen, naproxen, etc.) Piroxicam (Feldene) Propafenone (Rythmol SR) Propranolol (Inderal) Isoniazid (INH) Posaconazole (Noxafil) Barbiturates (Phenobarbital) Carbamazepine (Tegretol) Chlordiazepoxide (Librium) Cholestyramine (Questran) Griseofulvin Oral Contraceptives Rifampin Sucralfate (Carafate) Vitamin K   Coumadin (Warfarin) Major Herbal Interactions  Increased Warfarin Effect Decreased Warfarin Effect  Garlic Ginseng Ginkgo biloba Coenzyme Q10 Green tea St. Johns wort    Coumadin (Warfarin) FOOD Interactions  Eat a consistent number of servings per week of foods HIGH in Vitamin K (1 serving =  cup)  Collards (cooked, or boiled & drained) Kale (cooked, or boiled & drained) Mustard greens (cooked, or boiled & drained) Parsley *serving size only =  cup Spinach (cooked, or boiled &  drained) Swiss chard (cooked, or boiled & drained) Turnip greens (cooked, or boiled & drained)  Eat a consistent number of servings per week of foods MEDIUM-HIGH in Vitamin K (1 serving = 1 cup)  Asparagus (cooked, or boiled & drained) Broccoli (cooked, boiled & drained, or raw & chopped) Brussel sprouts (cooked, or boiled & drained) *serving size only =  cup Lettuce, raw (green leaf, endive, romaine) Spinach, raw Turnip greens, raw & chopped   These websites have more information on Coumadin (warfarin):  http://www.king-russell.com/; https://www.hines.net/;

## 2015-05-02 NOTE — Progress Notes (Signed)
VASCULAR LAB PRELIMINARY  PRELIMINARY  PRELIMINARY  PRELIMINARY  Bilateral lower extremity venous duplex  completed.    Preliminary report:  Bilateral:  No evidence of DVT, superficial thrombosis, or Baker's Cyst.    Sarath Privott, RVT 05/02/2015, 4:23 PM

## 2015-05-02 NOTE — Plan of Care (Signed)
Problem: RH SKIN INTEGRITY Goal: RH STG SKIN FREE OF INFECTION/BREAKDOWN With Mod. Assisst.  Problem: RH PAIN MANAGEMENT Goal: RH STG PAIN MANAGED AT OR BELOW PT'S PAIN GOAL Less than 3,on scale 1 to 10 Goal: RH OTHER STG PAIN MANAGEMENT GOALS W/ASSIST Other STG Pain Management Goals With Assistance.  Mod. assist.

## 2015-05-02 NOTE — Progress Notes (Signed)
Nursing Note: Pt voided in urinal.R: Bladder scan for 36 cc.wbb
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
## 2015-05-03 ENCOUNTER — Inpatient Hospital Stay (HOSPITAL_COMMUNITY): Payer: Commercial Managed Care - HMO | Admitting: Occupational Therapy

## 2015-05-03 ENCOUNTER — Inpatient Hospital Stay (HOSPITAL_COMMUNITY): Payer: Commercial Managed Care - HMO | Admitting: Physical Therapy

## 2015-05-03 DIAGNOSIS — S32810S Multiple fractures of pelvis with stable disruption of pelvic ring, sequela: Secondary | ICD-10-CM

## 2015-05-03 DIAGNOSIS — S32402S Unspecified fracture of left acetabulum, sequela: Secondary | ICD-10-CM

## 2015-05-03 LAB — PROTIME-INR
INR: 2.47 — AB (ref 0.00–1.49)
Prothrombin Time: 26.5 seconds — ABNORMAL HIGH (ref 11.6–15.2)

## 2015-05-03 LAB — URINE CULTURE

## 2015-05-03 MED ORDER — WARFARIN SODIUM 7.5 MG PO TABS
7.5000 mg | ORAL_TABLET | Freq: Once | ORAL | Status: AC
  Administered 2015-05-03: 7.5 mg via ORAL
  Filled 2015-05-03: qty 1
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
## 2015-05-03 NOTE — Progress Notes (Signed)
Physical Therapy Session Note  Patient Details  Name: Mathew Norman MRN: [id number] Date of Birth: 1996-01-10  Today's Date: 05/03/2015 PT Individual Time: 0945-1030 PT Individual Time Calculation (min): 45 min   Short Term Goals: Week 1:  PT Short Term Goal 1 (Week 1): =LTG due to ELOS  Skilled Therapeutic Interventions/Progress Updates:  Pt was seen bedside in the am. Pt was able to perform transfers wc to mat, mat to w/c with rolling walker and min A with verbal cues. Pt improved ability to maintain TDWB L LE. Treatment focused on LE strengthening which included LAQs seated in w/c, then heel slides and hip abd/add supine on mat, 3 sets x 10 reps each. Increased assistance required with exercises L LE. Following treatment pt returned to room and left sitting up in w/c with call bell within reach.   Therapy Documentation Precautions:  Precautions Precautions: Fall Restrictions Weight Bearing Restrictions: Yes LLE Weight Bearing: Non weight bearing Other Position/Activity Restrictions: MD states TDWB for transfers only, otherwise NWB, No ambulation  General:   Pain: Pt c/o 4/10 pain L LE.   Function:   Bed Mobility    Sit to lying activity   Assist level: Mod assist (Pt 50 - 74%, lift 2 legs)  Lying to sitting activity   Assist level: Moderate assist (Pt 50 - 74%, lift 2 legs)  Mobility details Bed mobility details: Verbal cues for techniques   Transfers Sit to stand transfer   Sit to stand assist level: Touching or steadying assistance (Pt > 75%/lift 1 leg) Sit to stand assistive device: Walker;Armrests  Chair/bed transfer   Chair/bed transfer method: Stand pivot Chair/bed transfer assist level: Touching or steadying assistance (Pt > 75%) Chair/bed transfer assistive device: Armrests;Walker   Chair/bed transfer details: Verbal cues for technique;Verbal cues for precautions/safety          Locomotion                               Wheelchair   Type:  Manual Max wheelchair distance: 150 Assist Level: Supervision or verbal cues  Wheel 50 feet with 2 turns activity   Assist Level: Supervision or verbal cues  Wheel 150 feet activity   Assist Level: Supervision or verbal cues   Cognition Comprehension Comprehension assist level: Follows complex conversation/direction with no assist  Expression Expression assist level: Expresses complex ideas: With no assist  Social Interaction Social Interaction assist level: Interacts appropriately with others - No medications needed.  Problem Solving Problem solving assist level: Solves complex problems: Recognizes & self-corrects  Memory Memory assist level: Complete Independence: No helper    Therapy/Group: Individual Therapy  [name], [place] 05/03/2015, 3:35 PM
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
## 2015-05-03 NOTE — Progress Notes (Signed)
Occupational Therapy Session Note  Patient Details  Name: Mathew Norman MRN: [id number] Date of Birth: 1996/06/22  Today's Date: 05/03/2015 OT Individual Time: 4098-1191 OT Individual Time Calculation (min): 57 min    Short Term Goals: Week 1:  OT Short Term Goal 1 (Week 1): STGs equal to LTGs based on LOS  Skilled Therapeutic Interventions/Progress Updates:  Upon entering the room, pt supine in bed with 6/10 pain in pelvis and L LE. Pt reporting pain medication given prior to therapist arrival. Skilled OT intervention with focus on self care retraining, functional transfers, safety, and pt education. Bathing completed at shower level with max A stand pivot wheelchair <> TTB. Pt reporting need for toileting after shower with successful BM and LB dressing performed from [facility] and standing with use of RW. UB dressing and grooming performed while seated in wheelchair . Call bell and all needed items within reach upon exiting the room. See below for further details.   Therapy Documentation Precautions:  Precautions Precautions: Fall Restrictions Weight Bearing Restrictions: Yes LLE Weight Bearing: Non weight bearing Other Position/Activity Restrictions: MD states TDWB for transfers only, otherwise NWB, No ambulation  General:   Vital Signs:   Pain: Pain Assessment Pain Assessment: 0-10 Pain Score: 6  Pain Type: Acute pain Pain Location: Pelvis Pain Orientation: Right Pain Descriptors / Indicators: Aching Pain Onset: On-going Pain Intervention(s): Medication (See eMAR)  Function:   Eating Eating               Grooming Oral Care,Brush Teeth, Clean Dentures Activity:      Assist Level: Set up   Set up : To obtain items  Wash, Rinse, Dry Face Activity   Assist Level: Supervision or verbal cues;Set up   Set up : To obtain items  Wash, Rinse, Dry Hands Activity   Assist Level: Set up   Set up : To obtain items  Brush, Comb Hair Activity        Shave Activity  Shave activity did not occur: N/A        Apply Makeup Activity Apply makeup activity did not occur: Patient does not wear makeup                                                           Bathing Bathing position   Position: Shower  Bathing parts Body parts bathed by patient: Right arm;Left arm;Chest;Abdomen;Front perineal area;Right upper leg;Left upper leg Body parts bathed by helper: Back;Left lower leg;Right lower leg;Buttocks  Bathing assist         Upper Body Dressing/Undressing Upper body dressing   What is the patient wearing?: Pull over shirt/dress     Pull over shirt/dress - Perfomed by patient: Thread/unthread right sleeve;Thread/unthread left sleeve;Put head through opening;Pull shirt over trunk          Upper body assist Assist Level: Set up   Set up : To obtain clothing/put away   Lower Body Dressing/Undressing Lower body dressing   What is the patient wearing?: Socks;Underwear;Pants   Underwear - Performed by helper: Thread/unthread right underwear leg;Thread/unthread left underwear leg;Pull underwear up/down   Pants- Performed by helper: Thread/unthread right pants leg;Thread/unthread left pants leg;Pull pants up/down;Fasten/unfasten pants       Socks - Performed by helper: Don/doff right sock;Don/doff left sock  Lower body assist         Toileting Toileting     Toileting steps completed by helper: Performs perineal hygiene;Adjust clothing prior to toileting;Adjust clothing after toileting    Toileting assist      Bed Mobility Roll left and right activity      Sit to lying activity   Assist level: Mod assist (Pt 50 - 74%, lift 2 legs)  Lying to sitting activity   Assist level: Moderate assist (Pt 50 - 74%, lift 2 legs)  Mobility details Bed mobility details: Verbal cues for techniques;Manual facilitation for placement   Transfers Sit to stand transfer   Sit to stand assist level: Moderate assist (Pt 50 - 74%/lift 2  legs/lift or lower)    Chair/bed transfer   Chair/bed transfer method: Stand pivot Chair/bed transfer assist level: Moderate assist (Pt 50 - 74%/lift or lower) Chair/bed transfer assistive device: Bedrails;Armrests;Walker   Chair/bed transfer details: Verbal cues for safe use of DME/AE;Verbal cues for technique;Verbal cues for precautions/safety  Toilet transfer   Toilet transfer assistive device: Bedside commode;Walker Assist level to bedside commode (at bedside): Moderate assist (Pt 50 - 74%/lift or lower) Assist level from bedside commode (at bedside): Moderate assist (Pt 50 - 74%/lift or lower)        Tub/shower transfer   Tub/shower assistive device: Tub transfer bench;Grab bars   Assist level into tub: Maximal assist (Pt 25 - 49%/lift and lower) Assist level out of tub: Maximal assist (Pt 25 - 49%/lift and lower)   Cognition Comprehension Comprehension assist level: Follows complex conversation/direction with no assist  Expression Expression assist level: Expresses complex ideas: With no assist  Social Interaction Social Interaction assist level: Interacts appropriately with others - No medications needed.  Problem Solving Problem solving assist level: Solves complex problems: Recognizes & self-corrects  Memory Memory assist level: Complete Independence: No helper    Therapy/Group: Individual Therapy  Lowella Grip 05/03/2015, 10:00 AM

## 2015-05-04 ENCOUNTER — Inpatient Hospital Stay (HOSPITAL_COMMUNITY): Payer: Commercial Managed Care - HMO | Admitting: Occupational Therapy

## 2015-05-04 ENCOUNTER — Inpatient Hospital Stay (HOSPITAL_COMMUNITY): Payer: Commercial Managed Care - HMO | Admitting: Physical Therapy

## 2015-05-04 LAB — PROTIME-INR
INR: 1.74 — AB (ref 0.00–1.49)
PROTHROMBIN TIME: 20.3 s — AB (ref 11.6–15.2)

## 2015-05-04 MED ORDER — WARFARIN SODIUM 5 MG PO TABS
10.0000 mg | ORAL_TABLET | Freq: Once | ORAL | Status: AC
  Administered 2015-05-04: 10 mg via ORAL
  Filled 2015-05-04: qty 2

## 2015-05-04 NOTE — Progress Notes (Signed)
Occupational Therapy Session Note  Patient Details  Name: Mathew Norman MRN: [id number] Date of Birth: 1996/08/12  Today's Date: 05/04/2015 OT Individual Time: 8295-6213 OT Individual Time Calculation (min): 58 min    Short Term Goals: Week 1:  OT Short Term Goal 1 (Week 1): STGs equal to LTGs based on LOS  Skilled Therapeutic Interventions/Progress Updates:  Upon entering the room, pt supine in bed with 5/10 c/o pain in pelvis but reports receiving medication prior to therapist arrival. Pt agreeable to OT session and requests bathing while seated on EOB. Skilled OT intervention with focus on ADL training , AE education, and pt education. Pt performing UB self care with set up. OT demonstrating and educating pt on use of long handled reacher, shoe horn, and sock aide to increaser LB self care. Pt returned demonstrations for dressing this session with min verbal cues for proper technique. Pt standing and NWB in L LE to wash buttocks, peri area, and LB clothing management with min A for balance. Pt seated in wheelchair and propelled self to sink for grooming tasks while seated. Call bell and all needed items within reach upon exiting the room.   Therapy Documentation Precautions:  Precautions Precautions: Fall Restrictions Weight Bearing Restrictions: Yes LLE Weight Bearing: Non weight bearing Other Position/Activity Restrictions: MD states TDWB for transfers only, otherwise NWB, No ambulation  Vital Signs: Therapy Vitals Temp: 98.4 F (36.9 C) Temp Source: Oral Pulse Rate: 88 Resp: 20 BP: 133/60 mmHg Patient Position (if appropriate): Lying Oxygen Therapy SpO2: 98 % O2 Device: Not Delivered Pain: Pain Assessment Pain Assessment: 0-10 Pain Score: 5  Pain Type: Acute pain Pain Location: Pelvis Pain Descriptors / Indicators: Sore Pain Onset: On-going Pain Intervention(s): Medication (See eMAR)  Function:     Grooming Oral Care,Brush Teeth, Clean Dentures Activity:       Assist Level: Supervision or verbal cues      Wash, Rinse, Dry Face Activity   Assist Level: Supervision or verbal cues      Wash, Rinse, Dry Hands Activity   Assist Level: Supervision or verbal cues      Brush, Comb Hair Activity   Assist Level: Supervision or verbal cues    Shave Activity          Apply Makeup Activity                                                             Bathing Bathing position   Position: Sitting EOB  Bathing parts Body parts bathed by patient: Right arm;Left arm;Chest;Abdomen;Front perineal area;Right upper leg;Left upper leg;Buttocks Body parts bathed by helper: Right lower leg;Left lower leg;Back  Bathing assist Assist Level: Touching or steadying assistance(Pt > 75%)       Upper Body Dressing/Undressing Upper body dressing   What is the patient wearing?: Pull over shirt/dress     Pull over shirt/dress - Perfomed by patient: Thread/unthread right sleeve;Thread/unthread left sleeve;Put head through opening;Pull shirt over trunk          Upper body assist Assist Level: Set up   Set up : To obtain clothing/put away   Lower Body Dressing/Undressing Lower body dressing   What is the patient wearing?: Socks;Underwear;Pants;Shoes Underwear - Performed by patient: Thread/unthread right underwear leg;Thread/unthread left underwear leg;Pull underwear up/down  Pants- Performed by patient: Thread/unthread right pants leg;Thread/unthread left pants leg;Pull pants up/down;Fasten/unfasten pants       Socks - Performed by patient: Don/doff right sock   Shoes - Performed by patient: Don/doff right shoe Shoes - Performed by helper: Fasten right          Lower body assist Assist Level: Touching or steadying assistance (Pt > 75%) (use of AE)        Bed Mobility Roll left and right activity      Sit to lying activity   Assist level: Mod assist (Pt 50 - 74%, lift 2 legs)  Lying to sitting activity   Assist level: Moderate assist  (Pt 50 - 74%, lift 2 legs)  Mobility details Bed mobility details: Verbal cues for techniques   Transfers Sit to stand transfer   Sit to stand assist level: Touching or steadying assistance (Pt > 75%/lift 1 leg)    Chair/bed transfer   Chair/bed transfer method: Stand pivot Chair/bed transfer assist level: Touching or steadying assistance (Pt > 75%) Chair/bed transfer assistive device: Armrests;Walker   Chair/bed transfer details: Verbal cues for Chief of Staff transfer             Cognition Comprehension Comprehension assist level: Follows complex conversation/direction with no assist  Expression Expression assist level: Expresses complex ideas: With no assist  Social Interaction Social Interaction assist level: Interacts appropriately with others - No medications needed.  Problem Solving Problem solving assist level: Solves complex problems: Recognizes & self-corrects  Memory Memory assist level: Complete Independence: No helper    Therapy/Group: Individual Therapy  Lowella Grip 05/04/2015, 9:46 AM

## 2015-05-04 NOTE — Progress Notes (Signed)
Occupational Therapy Session Note  Patient Details  Name: Mathew Norman MRN: [id number] Date of Birth: [birth year]  Today's Date: 05/04/2015 OT Individual Time: 1300-1345 OT Individual Time Calculation (min): 45 min    Short Term Goals: Week 1:  OT Short Term Goal 1 (Week 1): STGs equal to LTGs based on LOS  Skilled Therapeutic Interventions/Progress Updates:  Upon entering the room, pt seated in wheelchair with 4/10 pain in pelvis but agreeable to OT intervention. Elastic shoe laces placed into shoes in order to increase I with LB dressing. Pt then utilizing long handled reacher to don R shoe with increased time. Pt propelled wheelchair 90' into gym and engaged in standing balance activity of tossing rings/horseshoes with min A balance and pt maintaining NWB and occasional TDWB of L LE during task. Pt then sitting in wheelchair and propelling to each ring to retrieve with reacher. Pt engaged in STS x 6 reps during this task. Pt propelled wheelchair into ADL apartment for min A stand pivot transfer onto standard bed with use of RW. OT educated pt on use of leg lifter for sit >supine with pt demonstrating understanding but continuing to need min A secondary to pain. Pt returned to wheelchair in same manner and propelled back to room. Pt transferring back into bed for rest secondary to fatigue. See below for details. Call bell and all needed items within reach upon exiting the room.   Therapy Documentation Precautions:  Precautions Precautions: Fall Restrictions Weight Bearing Restrictions: Yes LLE Weight Bearing: Non weight bearing Other Position/Activity Restrictions: MD states TDWB for transfers only, otherwise NWB, No ambulation  General:   Vital Signs: Therapy Vitals Temp: 99.4 F (37.4 C) Temp Source: Oral Pulse Rate: (!) 108 Resp: 18 BP: 130/86 mmHg Patient Position (if appropriate): Lying Oxygen Therapy SpO2: 99 % O2 Device: Not Delivered Pain: Pain Assessment Pain  Assessment: 0-10 Pain Score: 6  Pain Type: Acute pain Pain Location: Pelvis Pain Descriptors / Indicators: Sore Pain Onset: On-going Pain Intervention(s): Medication (See eMAR)  Function:     Bed Mobility Roll left and right activity      Sit to lying activity   Assist level: Mod assist (Pt 50 - 74%, lift 2 legs)  Lying to sitting activity   Assist level: Moderate assist (Pt 50 - 74%, lift 2 legs)  Mobility details     Transfers Sit to stand transfer   Sit to stand assist level: Touching or steadying assistance (Pt > 75%/lift 1 leg) Sit to stand assistive device: Armrests;Walker  Chair/bed transfer   Chair/bed transfer method: Stand pivot Chair/bed transfer assist level: Touching or steadying assistance (Pt > 75%) Chair/bed transfer assistive device: Armrests;Walker   Chair/bed transfer details: Verbal cues for Chief of Staff transfer             Cognition Comprehension Comprehension assist level: Follows complex conversation/direction with no assist  Expression Expression assist level: Expresses complex ideas: With no assist  Social Interaction Social Interaction assist level: Interacts appropriately with others - No medications needed.  Problem Solving Problem solving assist level: Solves complex problems: Recognizes & self-corrects  Memory Memory assist level: Complete Independence: No helper    Therapy/Group: Individual Therapy  Lowella Grip 05/04/2015, 2:29 PM
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
## 2015-05-04 NOTE — Progress Notes (Signed)
Physical Therapy Session Note  Patient Details  Name: ERVING SASSANO MRN: [id number] Date of Birth: 1996-07-18  Today's Date: 05/04/2015 PT Individual Time: 1115-1200 PT Individual Time Calculation (min): 45 min   Short Term Goals: Week 1:  PT Short Term Goal 1 (Week 1): =LTG due to ELOS  Skilled Therapeutic Interventions/Progress Updates:  Pt was seen bedside in the am. Pt propelled w/c to gym. Treatment focused on LE strengthening, performed 3 sets x 10 reps for seated LAQs, supine heel slides, hip abd/add, SAQs. Pt required verbal cues and tactile cues for bed mobility on mat. Pt returned to room and left sitting up in w/c with call bell within reach.   Therapy Documentation Precautions:  Precautions Precautions: Fall Restrictions Weight Bearing Restrictions: Yes LLE Weight Bearing: Non weight bearing Other Position/Activity Restrictions: MD states TDWB for transfers only, otherwise NWB, No ambulation  General:   Pain: Pt c/o mod pain L LE.   Function:   Bed Mobility    Sit to lying activity   Assist level: Mod assist (Pt 50 - 74%, lift 2 legs)  Lying to sitting activity   Assist level: Moderate assist (Pt 50 - 74%, lift 2 legs)  Mobility details Bed mobility details: Verbal cues for techniques   Transfers Sit to stand transfer   Sit to stand assist level: Touching or steadying assistance (Pt > 75%/lift 1 leg) Sit to stand assistive device: Walker;Armrests  Chair/bed transfer   Chair/bed transfer method: Stand pivot Chair/bed transfer assist level: Touching or steadying assistance (Pt > 75%) Chair/bed transfer assistive device: Armrests;Walker   Chair/bed transfer details: Verbal cues for Insurance account manager   Type: Manual Max wheelchair distance: 150 Assist Level: Supervision or verbal cues  Wheel 50 feet with 2 turns activity   Assist Level: Supervision or verbal cues  Wheel 150 feet  activity   Assist Level: Supervision or verbal cues   Cognition Comprehension Comprehension assist level: Follows complex conversation/direction with no assist  Expression Expression assist level: Expresses complex ideas: With no assist  Social Interaction Social Interaction assist level: Interacts appropriately with others - No medications needed.  Problem Solving Problem solving assist level: Solves complex problems: Recognizes & self-corrects  Memory Memory assist level: Complete Independence: No helper    Therapy/Group: Individual Therapy  Carlie, Corpus 05/04/2015, 12:45 PM
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
## 2015-05-04 NOTE — Progress Notes (Signed)
Occupational Therapy Session Note  Patient Details  Name: Mathew Norman MRN: [id number] Date of Birth: 08/02/1996  Today's Date: 05/04/2015 OT Individual Time: 1700-1800 OT Individual Time Calculation (min): 60 min    Short Term Goals: Week 1:  OT Short Term Goal 1 (Week 1): STGs equal to LTGs based on LOS Week 2:     Skilled Therapeutic Interventions/Progress Updates:    Pt. Sitting EOB upon OT arrival.  Pt. Donned left shoe using elastic shoe laces and reacher.  Transferred to wc.  Propelled to kitchen.  Educated pt on wc mobility, kitchen safety, and sit to stand while baking cookies..  Pt stood to wash dishes for 5 minutes with SBA.  Maneuvered around close quarters with no assist.  Pt safe with one item meal prep.  Returned to room and transferred to bed with SBA.  Remained in bed with bed alarm on and call bell,phone within reach.   Therapy Documentation Precautions:  Precautions Precautions: Fall Restrictions Weight Bearing Restrictions: Yes LLE Weight Bearing: Non weight bearing Other Position/Activity Restrictions: MD states TDWB for transfers only, otherwise NWB, No ambulation  General:   Vital Signs:  Pain: Pain Assessment Pain Assessment: 0-10 Pain Score: 6  Pain Type: Acute pain Pain Location: Pelvis Pain Descriptors / Indicators: Sore Pain Onset: On-going Pain Intervention(s): Medication (See eMAR)       :    Function:   Eating Eating   Eating Assist Level: No help, No cues           Grooming Oral Care,Brush Teeth, Clean Dentures Activity:             Wash, Rinse, Dry Face Activity          Wash, Rinse, Dry Hands Activity          Brush, Comb Hair Activity        Shave Activity          Apply Makeup Activity                                                             Bathing Bathing position      Bathing parts      Bathing assist         Upper Body Dressing/Undressing Upper body dressing                     Upper body assist         Lower Body Dressing/Undressing Lower body dressing   What is the patient wearing?: Shoes;Pants Underwear - Performed by patient: Thread/unthread right underwear leg;Thread/unthread left underwear leg;Pull underwear up/down Underwear - Performed by helper: Pull underwear up/down Pants- Performed by patient: Thread/unthread right pants leg;Thread/unthread left pants leg;Pull pants up/down;Fasten/unfasten pants           Shoes - Performed by patient: Don/doff right shoe            Lower body assist Assist Level: Touching or steadying assistance (Pt > 75%)       Toileting Toileting          Toileting assist      Bed Mobility Roll left and right activity      Sit to lying activity   Assist level: Mod assist (Pt 50 - 74%, lift 2  legs)  Lying to sitting activity   Assist level: Moderate assist (Pt 50 - 74%, lift 2 legs)  Mobility details     Transfers Sit to stand transfer   Sit to stand assist level: Touching or steadying assistance (Pt > 75%/lift 1 leg) Sit to stand assistive device: Armrests;Walker  Chair/bed transfer   Chair/bed transfer method: Stand pivot Chair/bed transfer assist level: Touching or steadying assistance (Pt > 75%) Chair/bed transfer assistive device: Armrests;Walker   Chair/bed transfer details: Verbal cues for Chief of Staff transfer             Cognition Comprehension Comprehension assist level: Follows complex conversation/direction with no assist  Expression Expression assist level: Expresses complex ideas: With no assist  Social Interaction Social Interaction assist level: Interacts appropriately with others - No medications needed.  Problem Solving Problem solving assist level: Solves complex problems: Recognizes & self-corrects  Memory Memory assist level: Complete Independence: No helper    Therapy/Group: Individual Therapy  Humberto Seals 05/04/2015, 6:16 PM

## 2015-05-05 ENCOUNTER — Inpatient Hospital Stay (HOSPITAL_COMMUNITY): Payer: Commercial Managed Care - HMO | Admitting: Occupational Therapy

## 2015-05-05 ENCOUNTER — Encounter (HOSPITAL_COMMUNITY): Payer: Self-pay

## 2015-05-05 ENCOUNTER — Inpatient Hospital Stay (HOSPITAL_COMMUNITY): Payer: Commercial Managed Care - HMO

## 2015-05-05 ENCOUNTER — Inpatient Hospital Stay (HOSPITAL_COMMUNITY): Payer: Commercial Managed Care - HMO | Admitting: Physical Therapy

## 2015-05-05 DIAGNOSIS — E669 Obesity, unspecified: Secondary | ICD-10-CM

## 2015-05-05 DIAGNOSIS — S71112S Laceration without foreign body, left thigh, sequela: Secondary | ICD-10-CM

## 2015-05-05 DIAGNOSIS — S51011S Laceration without foreign body of right elbow, sequela: Secondary | ICD-10-CM

## 2015-05-05 LAB — PROTIME-INR
INR: 1.81 — AB (ref 0.00–1.49)
PROTHROMBIN TIME: 20.9 s — AB (ref 11.6–15.2)

## 2015-05-05 MED ORDER — WARFARIN SODIUM 5 MG PO TABS
10.0000 mg | ORAL_TABLET | Freq: Once | ORAL | Status: AC
  Administered 2015-05-05: 10 mg via ORAL
  Filled 2015-05-05: qty 2

## 2015-05-05 NOTE — Progress Notes (Signed)
Occupational Therapy Session Note  Patient Details  Name: Mathew Norman MRN: [id number] Date of Birth: 1996-08-22  Today's Date: 05/05/2015 OT Individual Time: 0800-0900 OT Individual Time Calculation (min): 60 min    Short Term Goals: Week 1:  OT Short Term Goal 1 (Week 1): STGs equal to LTGs based on LOS  Skilled Therapeutic Interventions/Progress Updates:    Pt performed bathing and dressing during session.  Min assist for transfer supine to sit with HOB flat and no rails.  Pt still needs assistance with advancing the LLE off of the bed but could problem solve how to bring trunk into sitting.  Transfers during session all min guard assist stand pivot with the RW.  No RW needed for in and out of the walk-in shower as pt could use the bars.  Provided LH sponge for bathing and discussed with him the need to know door widths at his sisters apartment.  He was also able to utilize the reacher and sockaide for LB dressing with setup as well.    Therapy Documentation Precautions:  Precautions Precautions: Fall Restrictions Weight Bearing Restrictions: Yes LLE Weight Bearing: Non weight bearing Other Position/Activity Restrictions: MD states TDWB for transfers only, otherwise NWB, No ambulation   Pain: Pain 2/10 in the left hip  ADL:   Function:   Eating Eating   Eating Assist Level: More than reasonable amount of time           Grooming Oral Care,Brush Teeth, Clean Dentures Activity:      Assist Level: Supervision or verbal cues   Set up : To obtain items  Wash, Rinse, Dry Face Activity   Assist Level: Supervision or verbal cues   Set up : To obtain items  Wash, Rinse, Dry Hands Activity   Assist Level: Supervision or verbal cues      Brush, Comb Hair Activity        Shave Activity          Apply Makeup Activity                                                             Bathing Bathing position   Position: Shower  Bathing parts Body parts bathed by  patient: Right arm;Left arm;Chest;Abdomen;Front perineal area;Right upper leg;Left upper leg;Buttocks;Right lower leg;Left lower leg    Bathing assist Assist Level: Supervision or verbal cues       Upper Body Dressing/Undressing Upper body dressing   What is the patient wearing?: Pull over shirt/dress     Pull over shirt/dress - Perfomed by patient: Thread/unthread right sleeve;Thread/unthread left sleeve;Put head through opening;Pull shirt over trunk          Upper body assist Assist Level: Set up   Set up : To obtain clothing/put away   Lower Body Dressing/Undressing Lower body dressing   What is the patient wearing?: Shoes;Pants;Underwear;Socks Underwear - Performed by patient: Thread/unthread right underwear leg;Thread/unthread left underwear leg;Pull underwear up/down   Pants- Performed by patient: Thread/unthread right pants leg;Thread/unthread left pants leg;Pull pants up/down;Fasten/unfasten pants       Socks - Performed by patient: Don/doff right sock;Don/doff left sock   Shoes - Performed by patient: Don/doff right shoe            Lower body assist Assist Level: Touching  or steadying assistance (Pt > 75%)       Toileting Toileting          Toileting assist      Bed Mobility Roll left and right activity      Sit to lying activity   Assist level: Mod assist (Pt 50 - 74%, lift 2 legs)  Lying to sitting activity   Assist level: Moderate assist (Pt 50 - 74%, lift 2 legs)  Mobility details Bed mobility details: Verbal cues for techniques   Transfers Sit to stand transfer   Sit to stand assist level: Supervision or verbal cues Sit to stand assistive device: Walker  Chair/bed transfer   Chair/bed transfer method: Stand pivot Chair/bed transfer assist level: Supervision or verbal cues Chair/bed transfer assistive device: Walker   Chair/bed transfer details: Verbal cues for precautions/safety  Toilet transfer                Tub/shower  transfer             Cognition Comprehension Comprehension assist level: Follows complex conversation/direction with no assist  Expression Expression assist level: Expresses complex ideas: With no assist  Social Interaction Social Interaction assist level: Interacts appropriately with others - No medications needed.  Problem Solving Problem solving assist level: Solves complex problems: Recognizes & self-corrects  Memory Memory assist level: Complete Independence: No helper    Therapy/Group: Individual Therapy  Eran Windish,Yanis OTR/L 05/05/2015, 12:15 PM

## 2015-05-05 NOTE — Progress Notes (Signed)
Physical Therapy Session Note  Patient Details  Name: Mathew Norman MRN: [id number] Date of Birth: July 31, 1996  Today's Date: 05/05/2015 PT Individual Time: 1530-1600 PT Individual Time Calculation (min): 30 min   Short Term Goals: Week 1:  PT Short Term Goal 1 (Week 1): =LTG due to ELOS  Skilled Therapeutic Interventions/Progress Updates:    Pt received supine in bed and agreeable to therex in bed. PT instructed patient in BLE therex x20 reps: ankle pumps with 2 second DF hold, quad sets with 2 sec hold, glute sets with 2 sec hold, heel slides (AAROM on LLE), hip abd/add (AAROM bilat), SLR (AAROM bilat), hip IR/ER, and SAQ.  Pt reports pain maintained at 4/10. Verbal cues throughout session for technique and breathing. Pt positioned to comfort with call bell in reach and needs met.   Therapy Documentation Precautions:  Precautions Precautions: Fall Restrictions Weight Bearing Restrictions: Yes LLE Weight Bearing: Non weight bearing Other Position/Activity Restrictions: MD states TDWB for transfers only, otherwise NWB, No ambulation   Pain: Pain Assessment Pain Assessment: 0-10 Pain Score: 4  Faces Pain Scale: Hurts a little bit Pain Type: Acute pain Pain Location: Pelvis Pain Orientation: Left Pain Descriptors / Indicators: Discomfort Pain Intervention(s): Emotional support;Repositioned;Elevated extremity Multiple Pain Sites: No   Function:   Therapy/Group: Individual Therapy  Earnest Conroy Penven-Crew 05/05/2015, 4:58 PM
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
## 2015-05-05 NOTE — Progress Notes (Signed)
Occupational Therapy Session Note  Patient Details  Name: Mathew Norman MRN: [id number] Date of Birth: 11/01/1995  Today's Date: 05/05/2015 OT Individual Time: 1300-1402 OT Individual Time Calculation (min): 62 min    Skilled Therapeutic Interventions/Progress Updates:    Pt rolled wheelchair throughout session with independence.  He was able to manage all footrests as well without assistance from therapist, including removing and putting them back on.  Practiced transfers to and from the therapy mat as well as transitioning from supine to sit.  Pt is able to use a leg lifter and manage both LEs when getting onto and off of the mat.  He is unable to lift either one onto the bed without use of this.  Performed multiple transitions supine to sit before transferring back to the wheelchair.  Second part of session focused on UE strengthening and endurance.  Pt performed 2, 5 min intervals of UE ergonometer exercise.  He was able to perform 2 mins on level 10 and 3 mins on level 15 for the first session.  RPMs maintained at 25-30 per minute.  Second set he peddled in reverse for 5 mins with resistance set on level 10 as well.  Pt with extreme fatigue reported in shoulders after completion of these two sets.    Therapy Documentation Precautions:  Precautions Precautions: Fall Restrictions Weight Bearing Restrictions: Yes LLE Weight Bearing: Non weight bearing Other Position/Activity Restrictions: MD states TDWB for transfers only, otherwise NWB, No ambulation   Pain: Pain Assessment Pain Assessment: Faces Faces Pain Scale: Hurts a little bit Pain Type: Acute pain Pain Location: Pelvis Pain Orientation: Left Pain Descriptors / Indicators: Discomfort Pain Intervention(s): Medication (See eMAR);Repositioned Multiple Pain Sites: No ADL:  Therapy/Group: Individual Therapy  Leatha Rohner,Jahon OTR/L 05/05/2015, 4:06 PM

## 2015-05-05 NOTE — Anesthesia Postprocedure Evaluation (Signed)
Anesthesia Post Note  Patient: Mathew Norman  Procedure(s) Performed: Procedure(s) (LRB): OPEN REDUCTION INTERNAL FIXATION (ORIF) ACETABULAR FRACTURE  (Left) SACRO-ILIAC PINNING (Left) INCISION AND DRAINAGE of left  thigh open wound, closure of right upper arm laceration. (Left) OPEN REDUCTION INTERNAL FIXATION (ORIF) PELVIC  RING FRACTURE (Left)  Anesthesia type: General  Patient location: PACU  Post pain: Pain level controlled and Adequate analgesia  Post assessment: Post-op Vital signs reviewed, Patient's Cardiovascular Status Stable, Respiratory Function Stable, Patent Airway and Pain level controlled  Last Vitals:  Filed Vitals:   05/01/15 1500  BP: 130/61  Pulse: 112  Temp: 37.5 C  Resp: 16    Post vital signs: Reviewed and stable  Level of consciousness: awake, alert  and oriented  Complications: No apparent anesthesia complications
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
## 2015-05-05 NOTE — Progress Notes (Signed)
Physical Therapy Session Note  Patient Details  Name: Mathew Norman MRN: [id number] Date of Birth: 1995/12/12  Today's Date: 05/05/2015 PT Individual Time: 0905-1005 PT Individual Time Calculation (min): 60 min   Short Term Goals: Week 1:  PT Short Term Goal 1 (Week 1): =LTG due to ELOS  Skilled Therapeutic Interventions/Progress Updates:  Pain rated 5/10 L thigh and pelvis; premedicated. Pt reported his sister will be moving his bed to her apt and ht will be approx the same as hospital bed. He did not have door measurements or bed measurements yet.  W/c propulsion on level tile x 150' with supervision.  PT adjusted bil legrests for comfort and to decrease strain on pelvis.  Standing during bil fine motor task x 1.5 min, with supervision, limited by pain. In supine,pt required 2 wedges and 2 pillows because supine on hard mat is painful to him.   Pt performed Surgical Hip Exs via hand out, demo, VCs. Sitting: long arc quad knee ext with 2# R, 0# RLe, Supine: bil knee presses, R/L heel slides on Maxi Slide for reduced friction, R/L short arc quad knee ext, L AA hip abd.  10 x 1 each, with cues for avoiding Valsalva maneuver, counting aloud.  Pt transferred to bed to rest. Bed alarm set and all needs placed within reach.    Therapy Documentation Precautions:  Precautions Precautions: Fall Restrictions Weight Bearing Restrictions: Yes LLE Weight Bearing: Non weight bearing Other Position/Activity Restrictions: MD states TDWB for transfers only, otherwise NWB, No ambulation         Function:  Toileting Toileting       Bed Mobility Roll left and right activity      Sit to lying activity   Assist level: Mod assist (Pt 50 - 74%, lift 2 legs)  Lying to sitting activity   Assist level: min assist (lift 1 leg)  Mobility details Bed mobility details: Verbal cues for techniques   Transfers Sit to stand transfer   Sit to stand assist level: Supervision or verbal cues Sit to  stand assistive device: Walker  Chair/bed transfer   Chair/bed transfer method: Stand pivot Chair/bed transfer assist level: Supervision or verbal cues Chair/bed transfer assistive device: Walker   Chair/bed transfer details: Verbal cues for Forensic psychologist Ambulation          Walk 10 feet activity      Walk 50 feet with 2 turns activity      Walk 150 feet activity      Walk 10 feet on uneven surfaces activity      Stairs          Walk up/down 1 step activity        Walk up/down 4 steps activity      Walk up/down 12 steps activity      Pick up small objects from floor      Wheelchair   Type: Manual Max wheelchair distance: 150 Assist Level: Supervision or verbal cues  Wheel 50 feet with 2 turns activity   Assist Level: Supervision or verbal cues  Wheel 150 feet activity   Assist Level: Supervision or verbal cues   Cognition Comprehension Comprehension assist level: Follows complex conversation/direction with no assist  Expression Expression assist level: Expresses complex ideas: With no assist  Social Interaction Social Interaction assist level: Interacts appropriately with others - No medications  needed.  Problem Solving Problem solving assist level: Solves complex problems: Recognizes & self-corrects  Memory Memory assist level: Complete Independence: No helper    Therapy/Group: Individual Therapy  [name] 05/05/2015, 12:39 PM

## 2015-05-05 NOTE — Progress Notes (Signed)
Sutures dc'd to suprapubic and left inguinal sites x2. Minimal drainage and steri strips applied to inguinal sites. Patient tolerated procedure well.

## 2015-05-05 NOTE — Progress Notes (Signed)
ANTICOAGULATION CONSULT NOTE - Follow Up Consult  Pharmacy Consult for coumadin Indication: VTE prophylaxis  No Known Allergies  Patient Measurements: Height:  (167.6 cm) Weight: 268 lb 8.3 oz (121.8 kg) IBW/kg (Calculated) : 63.8 Heparin Dosing Weight:   Vital Signs: Temp: 98.6 F (37 C) (08/22 0547) Temp Source: Oral (08/22 0547) BP: 126/79 mmHg (08/22 0547) Pulse Rate: 93 (08/22 0547)  Labs:  Recent Labs  05/03/15 0453 05/04/15 0533 05/05/15 0500  LABPROT 26.5* 20.3* 20.9*  INR 2.47* 1.74* 1.81*    Estimated Creatinine Clearance: 182 mL/min (by C-G formula based on Cr of 0.81).   Medications:  Scheduled:  . enoxaparin (LOVENOX) injection  60 mg Subcutaneous Q24H  . feeding supplement (ENSURE ENLIVE)  237 mL Oral BID BM  . methocarbamol  750 mg Oral TID  . OxyCODONE  20 mg Oral Q12H  . senna-docusate  2 tablet Oral QHS  . traMADol  100 mg Oral 4 times per day  . Warfarin - Physician Dosing Inpatient   Does not apply q1800   Infusions:    Assessment: 19 yo male is currently on subtherapeutic coumadin for VTE prophylaxis.  INR is up a little to 1.81.  Patient is also on lovenox. Goal of Therapy:  INR 2-3 Monitor platelets by anticoagulation protocol: Yes   Plan:  - coumadin 10 mg po x1 - INR in am - Continue lovenox 60 mg sq q24h until INR >/= 2  Mathew Norman, Mathew Norman 05/05/2015,8:05 AM

## 2015-05-05 NOTE — Care Management Note (Signed)
[facility] Individual Statement of Services  Patient Name:  Mathew Norman  Date:  05/05/2015  Welcome to the [facility].  Our goal is to provide you with an individualized program based on your diagnosis and situation, designed to meet your specific needs.  With this comprehensive rehabilitation program, you will be expected to participate in at least 3 hours of rehabilitation therapies Monday-Friday, with modified therapy programming on the weekends.  Your rehabilitation program will include the following services:  Physical Therapy (PT), Occupational Therapy (OT), 24 hour per day rehabilitation nursing, Therapeutic Recreaction (TR), Case Management (Social Worker), Rehabilitation Medicine, Nutrition Services and Pharmacy Services  Weekly team conferences will be held on Wednesday to discuss your progress.  Your Social Worker will talk with you frequently to get your input and to update you on team discussions.  Team conferences with you and your family in attendance may also be held.  Expected length of stay: 7-9 days  Overall anticipated outcome: supervision/mod/i level  Depending on your progress and recovery, your program may change. Your Social Worker will coordinate services and will keep you informed of any changes. Your Social Worker's name and contact numbers are listed  below.  The following services may also be recommended but are not provided by the [facility]:   Driving Evaluations  Home Health Rehabiltiation Services  Outpatient Rehabilitation Services  Vocational Rehabilitation   Arrangements will be made to provide these services after discharge if needed.  Arrangements include referral to agencies that provide these services.  Your insurance has been verified to be:  [facility] Your primary doctor is:  None-healthy prior to admission  Pertinent information will be shared with your doctor and your insurance  company.  Social Worker:  Dossie Der, SW [phone] or ([phone]  Information discussed with and copy given to patient by: Lucy Chris, 05/05/2015, 10:42 AM

## 2015-05-06 ENCOUNTER — Inpatient Hospital Stay (HOSPITAL_COMMUNITY): Payer: Commercial Managed Care - HMO | Admitting: Physical Therapy

## 2015-05-06 ENCOUNTER — Inpatient Hospital Stay (HOSPITAL_COMMUNITY): Payer: Commercial Managed Care - HMO | Admitting: Occupational Therapy

## 2015-05-06 ENCOUNTER — Inpatient Hospital Stay (HOSPITAL_COMMUNITY): Payer: Commercial Managed Care - HMO | Admitting: *Deleted

## 2015-05-06 LAB — PROTIME-INR
INR: 1.93 — ABNORMAL HIGH (ref 0.00–1.49)
PROTHROMBIN TIME: 21.9 s — AB (ref 11.6–15.2)

## 2015-05-06 MED ORDER — WARFARIN SODIUM 5 MG PO TABS
10.0000 mg | ORAL_TABLET | Freq: Once | ORAL | Status: AC
  Administered 2015-05-06: 10 mg via ORAL
  Filled 2015-05-06: qty 2

## 2015-05-06 MED ORDER — SORBITOL 70 % SOLN
30.0000 mL | Freq: Once | Status: AC
  Administered 2015-05-06: 30 mL via ORAL
  Filled 2015-05-06: qty 30

## 2015-05-06 MED ORDER — OXYCODONE HCL ER 10 MG PO T12A
10.0000 mg | EXTENDED_RELEASE_TABLET | Freq: Two times a day (BID) | ORAL | Status: DC
  Administered 2015-05-06 (×2): 10 mg via ORAL
  Filled 2015-05-06 (×3): qty 1

## 2015-05-06 NOTE — Progress Notes (Signed)
Recreational Therapy Session Note  Patient Details  Name: Mathew Norman MRN: [id number] Date of Birth: Oct 19, 1995 Today's Date: 05/06/2015  Order received and chart reviewed.  Met with pt today to discuss TR services and use of leisure time post discharge.  Pt able to state activities for participation and potential modifications with min questioning cues.  Pt with anticipated short LOS, therefore full eval deferred. [place] 05/06/2015, 3:49 PM

## 2015-05-06 NOTE — Progress Notes (Addendum)
Occupational Therapy Session Note  Patient Details  Name: Mathew Norman MRN: [id number] Date of Birth: 06-Apr-1996  Today's Date: 05/06/2015 OT Individual Time: 0805-0900 OT Individual Time Calculation (min): 55 min    Short Term Goals: Week 1:  OT Short Term Goal 1 (Week 1): STGs equal to LTGs based on LOS  Skilled Therapeutic Interventions/Progress Updates:    Session 1:  Pt performed shower, toileting, and dressing.  Min guard assist for transfer to the shower bench from the wheelchair and also when transferring to the [facility] over the toilet.  Close supervision for all bathing sit to stand using the grab bar for support.  Pt utilizes AE for washing his lower legs as well as for donning his LB clothing.    Session 2:  (1101-1200) Pt gathered clothing for washing from wheelchair level.  He was able to use the reacher for reaching to the floor.  He placed the clothing in a bag and hooked it around the back of his wheelchair to carry.  Pushed wheelchair down to the laundry room and then utilized RW for standing to start the washing machine and place the clothing in the washer.  Once he completed this task had him work on wheelchair mobility and transfers to regular bench outside of the hospital.  Practiced maneuvering wheelchair in and out of the elevator as well as up and down a slope.  He needed increased rest breaks to complete the upgrade from the parking deck to the atrium entrance of the hospital.  Returned to laundry room again with therapist assisting to push wheelchair from the atrium.  He stood with the RW again to transfer clothing from the washing machine to the dryer with supervision.  Returned to room to complete session with pt transferring to the 3:1 over the toilet.  Pt left on toilet with instructions to push the call button when he was ready to get up.      Session 3:  (4098-1191)  Pt began session with transfer from supine to sit EOB with close supervision using the leg lifter.   Transfer to wheelchair with close supervision and then to the toilet with supervision as well.  He completed all aspects of toileting as well with supervision.  Finished session with transfer down to the laundry room and had pt use the reacher to retrieve his items from the dryer.  He was able to complete with modified independence from wheelchair level.  He was able to propel himself back to the room with his clothing in a bag on the back of his wheelchair.    Therapy Documentation Precautions:  Precautions Precautions: Fall Restrictions Weight Bearing Restrictions: Yes LLE Weight Bearing: Non weight bearing Other Position/Activity Restrictions: MD states TDWB for transfers only, otherwise NWB, No ambulation    Pain: Pain Assessment Pain Assessment: Faces Pain Score: 5  Faces Pain Scale: Hurts a little bit Pain Type: Acute pain Pain Location: Pelvis Pain Orientation: Anterior Pain Descriptors / Indicators: Aching;Sore Pain Onset: On-going Pain Intervention(s): Rest Multiple Pain Sites: No ADL: Function:   Eating Eating   Eating Assist Level: No help, No cues           Grooming Oral Care,Brush Teeth, Clean Dentures Activity:      Assist Level: Supervision or verbal cues   Set up : To obtain items  Wash, Rinse, Dry Face Activity   Assist Level: Supervision or verbal cues   Set up : To obtain items  Wash, Rinse, Dry Hands  Activity   Assist Level: Supervision or verbal cues   Set up : To obtain items  Brush, Comb Hair Activity   Assist Level: Supervision or verbal cues    Shave Activity          Apply Makeup Activity                                                             Bathing Bathing position   Position: Shower  Bathing parts Body parts bathed by patient: Right arm;Left arm;Chest;Abdomen;Front perineal area;Right upper leg;Left upper leg;Buttocks;Right lower leg;Left lower leg    Bathing assist Assist Level: Supervision or verbal cues        Upper Body Dressing/Undressing Upper body dressing   What is the patient wearing?: Pull over shirt/dress     Pull over shirt/dress - Perfomed by patient: Thread/unthread right sleeve;Thread/unthread left sleeve;Put head through opening;Pull shirt over trunk          Upper body assist Assist Level: Assistive device Assistive Device Comment: wheelchair     Lower Body Dressing/Undressing Lower body dressing   What is the patient wearing?: Shoes;Pants;Underwear;Socks Underwear - Performed by patient: Thread/unthread right underwear leg;Thread/unthread left underwear leg;Pull underwear up/down           Socks - Performed by patient: Don/doff right sock;Don/doff left sock   Shoes - Performed by patient: Don/doff right shoe            Lower body assist Assist Level: Supervision or verbal cues       Toileting Toileting     Toileting steps completed by helper: Adjust clothing prior to toileting;Performs perineal hygiene;Adjust clothing after toileting    Toileting assist      Bed Mobility Roll left and right activity      Sit to lying activity   Assist level: Supervision or verbal cues  Lying to sitting activity   Assist level: Supervision or verbal cues  Mobility details Bed mobility details: Verbal cues for safe use of DME/AE   Transfers Sit to stand transfer   Sit to stand assist level: Supervision or verbal cues Sit to stand assistive device: Walker;Armrests  Chair/bed transfer   Chair/bed transfer method: Stand pivot Chair/bed transfer assist level: Supervision or verbal cues Chair/bed transfer assistive device: Walker;Armrests   Chair/bed transfer details: Verbal cues for precautions/safety  Toilet transfer   Toilet transfer assistive device: Bedside commode;Walker       Assist level to toilet: Supervision or verbal cues Assist level from toilet: Supervision or verbal cues  Tub/shower transfer   Tub/shower assistive device: Tub transfer  bench;Grab bars;Walk in shower   Assist level into tub: Supervision or verbal cues Assist level out of tub: Supervision or verbal cues   Cognition Comprehension Comprehension assist level: Follows complex conversation/direction with no assist  Expression Expression assist level: Expresses complex ideas: With no assist  Social Interaction Social Interaction assist level: Interacts appropriately with others - No medications needed.  Problem Solving Problem solving assist level: Solves complex problems: Recognizes & self-corrects  Memory Memory assist level: Complete Independence: No helper    Therapy/Group: Individual Therapy  Ula Couvillon,Zayed OTR/L 05/06/2015, 12:13 PM
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
## 2015-05-06 NOTE — Plan of Care (Signed)
Problem: RH PAIN MANAGEMENT Goal: RH STG PAIN MANAGED AT OR BELOW PT'S PAIN GOAL Less than 3,on scale 1` to 10  Outcome: Not Progressing Patient reports pain stays about 5 out of 10 with scheduled pain medication. Patient note requesting any PRNS

## 2015-05-06 NOTE — Progress Notes (Signed)
Physical Therapy Session Note  Patient Details  Name: Mathew Norman MRN: [id number] Date of Birth: 23-Jun-1996  Today's Date: 05/06/2015 PT Individual Time: 0930-1030 PT Individual Time Calculation (min): 60 min   Short Term Goals: Week 1:  PT Short Term Goal 1 (Week 1): =LTG due to ELOS  Skilled Therapeutic Interventions/Progress Updates:    Pt received up in w/c - agreeable to PT session. Therapeutic Activity - see below. Pt completes an additional mat transfer with wedge and pillows behind back for comfort. Car transfer at simulated low sedan height - pt able to self manage legs with arms. W/C Management - see below. PT provides verbal cues for negotiating a ramp, but also negotiates thresholds in a controlled environment. Therapeutic Exercise - PT instructs pt in AROM L LE exercises (use of leg lifter for active assist prn) within mild-moderate ROM and specific cues not to increase pain: ankle pumps x 20, heel slides x 10, supine hip abduction/adduction x 10, LAQ x 10.   PT has lengthy discussion with pt re: home modifications/adaptations to accommodate w/c mobility. PT gives pt a tape measure for sister to use and instructs him in how she should measure door width, taking door into account. PT explains to pt that a threshold ramp can be purchased or a more significant ramp can be rented temporarily from a company if headed to dad's home - handouts given of sample ramp rental companies. PT explains that pt can choose the option of sleeping on a couch instead of in a bedroom and if d/c destination has bathroom doorway too narrow to fit his w/c through, then he should obtain a BSC for use at home. PT also measures pt's 22" wide overall w/c width and notes full w/c width is just under 31" push rim to push rim, but wheel to wheel is just under 28". PT explains that pt can have CG take a screwdriver and remove push-rims if needed. Pt verbalizes understanding - continue per PT POC.    Therapy  Documentation Precautions:  Precautions Precautions: Fall Restrictions Weight Bearing Restrictions: Yes LLE Weight Bearing: Non weight bearing Other Position/Activity Restrictions: MD states TDWB for transfers only, otherwise NWB, No ambulation  Pain: Pain Assessment Pain Assessment: 0-10 Pain Score: 5  Pain Type: Acute pain;Surgical pain Pain Location: Pelvis Pain Orientation: Anterior Pain Descriptors / Indicators: Aching;Sore Pain Onset: On-going Pain Intervention(s): Rest Multiple Pain Sites: No   Function:  Toileting Toileting             Bed Mobility Roll left and right activity        Sit to lying activity   Assist level: Supervision or verbal cues Sit to lying assistive device: Other (leg lifter)  Lying to sitting activity   Assist level: Supervision or verbal cues Lying to sitting assistive device: Other (leg lifter)  Mobility details Bed mobility details: Verbal cues for safe use of DME/AE   Transfers Sit to stand transfer   Sit to stand assist level: Supervision or verbal cues Sit to stand assistive device: Walker;Armrests  Chair/bed transfer   Chair/bed transfer method: Stand pivot Chair/bed transfer assist level: Supervision or verbal cues Chair/bed transfer assistive device: Walker;Armrests   Chair/bed transfer details: Verbal cues for Scientist, clinical (histocompatibility and immunogenetics) transfer assistive device: Data processing manager transfer assist level: Supervision or verbal cues    Locomotion Ambulation  Walk 10 feet activity      Walk 50 feet with 2 turns activity      Walk 150 feet activity      Walk 10 feet on uneven surfaces activity      Stairs          Walk up/down 1 step activity        Walk up/down 4 steps activity      Walk up/down 12 steps activity      Pick up small objects from floor      Wheelchair   Type: Manual Max wheelchair distance: 150 Assist Level: Supervision or verbal  cues  Wheel 50 feet with 2 turns activity   Assist Level: Supervision or verbal cues  Wheel 150 feet activity   Assist Level: Supervision or verbal cues   Cognition Comprehension Comprehension assist level: Follows complex conversation/direction with no assist  Expression Expression assist level: Expresses complex ideas: With no assist  Social Interaction Social Interaction assist level: Interacts appropriately with others - No medications needed.  Problem Solving Problem solving assist level: Solves complex problems: Recognizes & self-corrects  Memory Memory assist level: Complete Independence: No helper    Therapy/Group: Individual Therapy  Walt Geathers M 05/06/2015, 10:08 AM
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
## 2015-05-07 ENCOUNTER — Inpatient Hospital Stay (HOSPITAL_COMMUNITY): Payer: Commercial Managed Care - HMO | Admitting: Occupational Therapy

## 2015-05-07 ENCOUNTER — Inpatient Hospital Stay (HOSPITAL_COMMUNITY): Payer: Commercial Managed Care - HMO

## 2015-05-07 ENCOUNTER — Inpatient Hospital Stay (HOSPITAL_COMMUNITY): Payer: Self-pay

## 2015-05-07 LAB — PROTIME-INR
INR: 1.96 — AB (ref 0.00–1.49)
Prothrombin Time: 22.2 seconds — ABNORMAL HIGH (ref 11.6–15.2)

## 2015-05-07 MED ORDER — WARFARIN SODIUM 5 MG PO TABS
10.0000 mg | ORAL_TABLET | Freq: Once | ORAL | Status: AC
  Administered 2015-05-07: 10 mg via ORAL
  Filled 2015-05-07: qty 2

## 2015-05-07 MED ORDER — SENNOSIDES-DOCUSATE SODIUM 8.6-50 MG PO TABS
2.0000 | ORAL_TABLET | Freq: Two times a day (BID) | ORAL | Status: DC
  Administered 2015-05-07 – 2015-05-08 (×2): 2 via ORAL
  Filled 2015-05-07 (×3): qty 2

## 2015-05-07 NOTE — Progress Notes (Addendum)
Physical Therapy Session Note  Patient Details  Name: Mathew Norman MRN: [id number] Date of Birth: 25-Aug-1996  Today's Date: 05/07/2015 PT Individual Time: 0800-0900 PT Individual Time Calculation (min): 60 min   Short Term Goals: Week 1:  PT Short Term Goal 1 (Week 1): =LTG due to ELOS  Skilled Therapeutic Interventions/Progress Updates:   Tx 1: Pt dozing, but wakened for therapy.  Cold items at breakfast put in pt refrigerator for later.  W/c propulsion to/from gym with distant supervision. W/c propulsion in room in congested area for positioning w/c next to bed, in/out of door which continually drifts open, supervision.  Therapeutic exercises performed with LE to increase strength for functional mobility: 10 x 1 each : sitting long arc quad knee ext 2# R, 0# Lwith 5 sec hold. bil hip add with 5 sec hold; standing L hip abd, L hip flex.  Pt required seated rest break between each set of 10.  Pt provided with General Strengthening hand out which includes standing hip abd and flex.  Pt instructed to perform supine and sitting exs on Surgical Hip hand out in his room 2x/day. He continues to need min cues to count out loud during reps to avoid holding his breath.  W/c parts mgt with leg rests on floor, supervision, cues to lock w/c before attempting.  Pt reported he will have to d/c to his home which has 5 STE.  His sister and boy friend are coming to next session, (OT) only.  PT requested family be trained during this time.    Pt set up for breakfast with all needs within reach.  Tx 2: personal rental w/c and RW adjusted for pt's size and comfort. Pt is aware that family may have to remove rims of w/c once he is in his house, to access interior doorways.    Pt performed simulated car transfer to sedan height seat, with supervision, min cues.    Therapeutic exercise performed with LEs to increase strength for functional mobility. Via hand-outs and manual cues: L heel slides, L hip  abd/add 20 x 1 each.   neuro re-ed provided for L hip add/abd, hip flex, because pt has difficulty recruiting appropriate hip muscles.    Therapy Documentation Precautions:  Precautions Precautions: Fall Restrictions Weight Bearing Restrictions: Yes LLE Weight Bearing: Non weight bearing Other Position/Activity Restrictions: MD states TDWB for transfers only, otherwise NWB, No ambulation    Pain: Pain Assessment Pain Assessment: 0-10 Pain Score: 5  Pain Location: Hip Pain Orientation: Left Pain Descriptors / Indicators: Aching Pain Intervention(s): Medication (See eMAR)       Function:  Toileting Toileting             Bed Mobility Roll left and right activity        Sit to lying activity        Lying to sitting activity   Assist level: Supervision or verbal cues    Mobility details Bed mobility details: Verbal cues for techniques   Transfers Sit to stand transfer   Sit to stand assist level: Supervision or verbal cues Sit to stand assistive device: Walker  Chair/bed transfer   Chair/bed transfer method: Stand pivot Chair/bed transfer assist level: Supervision or verbal cues Chair/bed transfer assistive device: Insurance risk surveyor Ambulation activity did not  occur: Safety/medical concerns        Walk 10 feet activity      Walk 50 feet with 2 turns activity      Walk 150 feet activity      Walk 10 feet on uneven surfaces activity      Stairs Stairs activity did not occur: Safety/medical concerns        Walk up/down 1 step activity   Stairs activity did not occur: Safety/medical concerns    Walk up/down 4 steps activity      Walk up/down 12 steps activity      Pick up small objects from floor      Wheelchair   Type: Manual Max wheelchair distance: 150 Assist Level: Supervision or verbal cues  Wheel 50 feet with 2 turns activity   Assist Level:  Supervision or verbal cues  Wheel 150 feet activity       Cognition Comprehension Comprehension assist level: Follows complex conversation/direction with no assist  Expression Expression assist level: Expresses complex ideas: With no assist  Social Interaction Social Interaction assist level: Interacts appropriately with others - No medications needed.  Problem Solving Problem solving assist level: Solves complex problems: Recognizes & self-corrects  Memory Memory assist level: Recognizes or recalls 75 - 89% of the time/requires cueing 10 - 24% of the time    Therapy/Group: Individual Therapy  [name] 05/07/2015, 9:08 AM

## 2015-05-07 NOTE — Progress Notes (Signed)
ANTICOAGULATION CONSULT NOTE - Follow Up Consult  Pharmacy Consult for couamdin Indication: VTE prophylaxis  No Known Allergies  Patient Measurements: Height:  (167.6 cm) Weight: 268 lb 8.3 oz (121.8 kg) IBW/kg (Calculated) : 63.8 Heparin Dosing Weight:   Vital Signs: Temp: 98.7 F (37.1 C) (08/24 0546) Temp Source: Oral (08/24 0546) BP: 125/67 mmHg (08/24 0546) Pulse Rate: 92 (08/24 0546)  Labs:  Recent Labs  05/05/15 0500 05/06/15 0635 05/07/15 0540  LABPROT 20.9* 21.9* 22.2*  INR 1.81* 1.93* 1.96*    Estimated Creatinine Clearance: 182 mL/min (by C-G formula based on Cr of 0.81).   Medications:  Scheduled:  . enoxaparin (LOVENOX) injection  60 mg Subcutaneous Q24H  . feeding supplement (ENSURE ENLIVE)  237 mL Oral BID BM  . methocarbamol  750 mg Oral TID  . senna-docusate  2 tablet Oral QHS  . traMADol  100 mg Oral 4 times per day  . Warfarin - Physician Dosing Inpatient   Does not apply q1800   Infusions:    Assessment: 19 yo male is currently on subtherapeutic coumadin for VTE prophylaxis.  INR today is up a little to 1.96.  Goal of Therapy:  INR 2-3 Monitor platelets by anticoagulation protocol: Yes   Plan:  - Coumadin 10 mg x 1 dose tonight  - Daily INR while on Coumadin  - d/c lovenox when INR >/= 2  Rianna Lukes, [name] 05/07/2015,8:09 AM
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
## 2015-05-07 NOTE — Progress Notes (Signed)
Social Work Note Patient ID: Mathew Norman, male   DOB: [birth year], 19 y.o.   MRN: [id number] Met with pt and sister who was here for family training this am.  Discussed team conference goals-mod/i-supervision wheelchair level and discharge 8/25. Pt is doing well with his weight bearing and moving, The issues is the size of the wheelchair and being able to fit into door ways and the space it takes up. Family to come back in am tomorrow to do car transfers and bumping up steps. Plan now to go to Dad's home and manage until WB status changed. Sister to go over to dad;s home to assist with pt's care. Discussed equipment and follow up needs. All in agreement will work toward  discharge for tomorrow.

## 2015-05-07 NOTE — Progress Notes (Signed)
Social Work Patient ID: Mathew Norman, male   DOB: [birth year], 19 y.o.   MRN: [id number] Spoke with sister via telephone to discuss concerns about pt going home and fitting through the doorways. She asked if his insurance would cover SNF. Discussed is it highly unlikely they would cover for him to go to a SNF due to inaccessibility of the home. He would fit through the main door and could stay in one room.  They plan to be here today to attend therapy, discussed the main one would be PT session. It is only for a short time his weight bearing status. Will see when here today.
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
## 2015-05-07 NOTE — Progress Notes (Signed)
Occupational Therapy Session Note  Patient Details  Name: Mathew Norman MRN: [id number] Date of Birth: 02/16/1996  Today's Date: 05/07/2015 OT Individual Time: 0865-7846 OT Individual Time Calculation (min): 68 min    Short Term Goals: Week 1:  OT Short Term Goal 1 (Week 1): STGs equal to LTGs based on LOS  Skilled Therapeutic Interventions/Progress Updates:   Pt performed bathing and dressing with overall supervision level during session.  AE utilized for LB dressing and bathing tasks.  Pt's sister and boyfriend present for education.  Performed education on bumping wheelchair up and down the steps to get into his father's house.  Therapist and boyfriend were able to bump pt up 4 steps to simulate transfer at home.  They plan to come back in the am tomorrow for further education.     Therapy Documentation Precautions:  Precautions Precautions: Fall Restrictions Weight Bearing Restrictions: Yes LLE Weight Bearing: Non weight bearing Other Position/Activity Restrictions: MD states TDWB for transfers only, otherwise NWB, No ambulation   Pain: Pain Assessment Pain Assessment: No/denies pain Pain Score: 5  Pain Location: Hip Pain Orientation: Left Pain Intervention(s): Medication (See eMAR) ADL: Function:   Eating Eating               Grooming Oral Care,Brush Teeth, Clean Dentures Activity:      Assist Level: Assistive device Assistive Device Comment: wheelchair Set up : To obtain items  Wash, Rinse, Dry Face Activity   Assist Level: Assistive device Assistive Device Comment: wheelchair     Wash, Rinse, Dry Hands Activity   Assist Level: Assistive device Assistive Device Comment: wheelchair    Brush, Comb Hair Activity        Shave Activity          Apply Makeup Activity                                                             Bathing Bathing position   Position: Shower  Bathing parts Body parts bathed by patient: Right arm;Left  arm;Chest;Abdomen;Front perineal area;Right upper leg;Left upper leg;Buttocks;Right lower leg;Left lower leg;Back    Bathing assist Assist Level: Supervision or verbal cues       Upper Body Dressing/Undressing Upper body dressing   What is the patient wearing?: Pull over shirt/dress     Pull over shirt/dress - Perfomed by patient: Thread/unthread right sleeve;Thread/unthread left sleeve;Put head through opening;Pull shirt over trunk          Upper body assist Assist Level: Assistive device Assistive Device Comment: wheelchair     Lower Body Dressing/Undressing Lower body dressing   What is the patient wearing?: Pants;Underwear;Socks Underwear - Performed by patient: Thread/unthread right underwear leg;Thread/unthread left underwear leg;Pull underwear up/down   Pants- Performed by patient: Thread/unthread right pants leg;Thread/unthread left pants leg;Pull pants up/down;Fasten/unfasten pants       Socks - Performed by patient: Don/doff right sock;Don/doff left sock                Lower body assist Assist Level: Supervision or verbal cues       Toileting Toileting          Toileting assist      Bed Mobility Roll left and right activity      Sit to lying activity  Lying to sitting activity   Assist level: Supervision or verbal cues  Mobility details Bed mobility details: Verbal cues for techniques   Transfers Sit to stand transfer   Sit to stand assist level: Supervision or verbal cues Sit to stand assistive device: Walker  Chair/bed transfer   Chair/bed transfer method: Stand pivot Chair/bed transfer assist level: Supervision or verbal cues Chair/bed transfer assistive device: Teacher, early years/pre assistive device: Tub transfer bench;Grab bars;Walk in shower   Assist level into tub: Supervision or verbal cues Assist level out of tub: Supervision or verbal cues    Cognition Comprehension Comprehension assist level: Follows complex conversation/direction with no assist  Expression Expression assist level: Expresses complex ideas: With no assist  Social Interaction Social Interaction assist level: Interacts appropriately with others - No medications needed.  Problem Solving Problem solving assist level: Solves complex problems: Recognizes & self-corrects  Memory Memory assist level: Recognizes or recalls 75 - 89% of the time/requires cueing 10 - 24% of the time    Therapy/Group: Individual Therapy  Paullette Mckain,Kassidy OTR/L 05/07/2015, 11:37 AM

## 2015-05-07 NOTE — Progress Notes (Signed)
Sutures removed from RUE and left thigh. Also removed 1 remaining suture from abdomen. Patient tolerated procedure well. Foam dressing applied to RUE per order. Patient resting comfortably in bed with family visiting in room.

## 2015-05-08 ENCOUNTER — Inpatient Hospital Stay (HOSPITAL_COMMUNITY): Payer: Commercial Managed Care - HMO | Admitting: Physical Therapy

## 2015-05-08 ENCOUNTER — Inpatient Hospital Stay (HOSPITAL_COMMUNITY): Payer: Commercial Managed Care - HMO | Admitting: Occupational Therapy

## 2015-05-08 LAB — PROTIME-INR
INR: 2.05 — AB (ref 0.00–1.49)
Prothrombin Time: 23 seconds — ABNORMAL HIGH (ref 11.6–15.2)

## 2015-05-08 MED ORDER — OXYCODONE-ACETAMINOPHEN 7.5-325 MG PO TABS: 1.0000 | ORAL_TABLET | Freq: Four times a day (QID) | ORAL | Status: DC | PRN

## 2015-05-08 MED ORDER — OXYCODONE HCL 5 MG PO TABS: 5.0000 mg | ORAL_TABLET | Freq: Four times a day (QID) | ORAL | Status: DC | PRN

## 2015-05-08 MED ORDER — SENNOSIDES-DOCUSATE SODIUM 8.6-50 MG PO TABS: 2.0000 | ORAL_TABLET | Freq: Two times a day (BID) | ORAL | Status: DC

## 2015-05-08 MED ORDER — TRAMADOL HCL 50 MG PO TABS: 50.0000 mg | ORAL_TABLET | Freq: Four times a day (QID) | ORAL | Status: DC | PRN

## 2015-05-08 MED ORDER — METHOCARBAMOL 750 MG PO TABS: 750.0000 mg | ORAL_TABLET | Freq: Three times a day (TID) | ORAL | Status: DC | PRN

## 2015-05-08 MED ORDER — WARFARIN SODIUM 5 MG PO TABS: 10.0000 mg | ORAL_TABLET | Freq: Every day | ORAL | Status: DC

## 2015-05-08 MED ORDER — OXYCODONE-ACETAMINOPHEN 5-325 MG PO TABS: 1.0000 | ORAL_TABLET | Freq: Four times a day (QID) | ORAL | Status: DC | PRN

## 2015-05-08 MED ORDER — OXYCODONE HCL 5 MG PO TABS: 2.5000 mg | ORAL_TABLET | Freq: Four times a day (QID) | ORAL | Status: DC | PRN

## 2015-05-08 MED ORDER — WARFARIN SODIUM 5 MG PO TABS
10.0000 mg | ORAL_TABLET | Freq: Once | ORAL | Status: AC
  Administered 2015-05-08: 10 mg via ORAL
  Filled 2015-05-08: qty 2

## 2015-05-08 NOTE — Progress Notes (Signed)
Occupational Therapy Discharge Summary  Patient Details  Name: Mathew Norman MRN: [id number] Date of Birth: 10/09/1995  Today's Date: 05/08/2015 OT Individual Time: 0800-0900 OT Individual Time Calculation (min): 60 min   Session Note:  Pt able to complete bathing and dressing with modified independence overall.  Use of AE for LB selfcare as well as use of RW for sit to stand and standing balance during LB dressing.  No report of pain noted.  Issued pt handout with therapist demonstration on UE exercises using the therex issued yesterday.  Talked on the phone with his sister at end of session as they are still concerned with providing care for Mathew Norman.  Again discussed that he has reached a modified independent level for most selfcare tasks and does not qualify for continued OT inpatient, and will not need follow-up OT.    Patient has met 10 of 10 long term goals due to improved activity tolerance, improved balance and ability to compensate for deficits.  Patient to discharge at overall Modified Independent level.  Patient's care partner is independent to provide the necessary physical assistance at discharge.    Reasons goals not met: NA  Recommendation:  Pt has reached modified independent level for most selfcare tasks from wheelchair level since he is not allowed to ambulate with the walker.  He was able to use the reacher, LH sponge, and sockaide for LB dressing tasks and is planning to purchase these items from the gift shop.  Feel he will not need any post rehab OT at this time.  Family has been educated on his performance of ADL tasks and understands that he will need 2-3 strong individuals to bump him up and down the steps.    Equipment: wide BSC  Reasons for discharge: treatment goals met and discharge from hospital  Patient/family agrees with progress made and goals achieved: Yes  OT Discharge Precautions/Restrictions  Precautions Precautions: Fall Restrictions Weight  Bearing Restrictions: Yes LLE Weight Bearing: Non weight bearing Other Position/Activity Restrictions: MD states TDWB for transfers only, otherwise NWB, No ambulation   Pain Pain Assessment Pain Assessment: 0-10 Pain Score: 4  Pain Type: Acute pain;Surgical pain Pain Location: Pelvis Pain Orientation: Left Pain Descriptors / Indicators: Aching Pain Onset: On-going Pain Intervention(s): Rest Multiple Pain Sites: No ADL  See Function section of chart  Vision/Perception  Vision- History Baseline Vision/History: Wears glasses Wears Glasses: At all times Patient Visual Report: No change from baseline Vision- Assessment Vision Assessment?: No apparent visual deficits  Cognition Overall Cognitive Status: Within Functional Limits for tasks assessed Arousal/Alertness: Awake/alert Orientation Level: Oriented X4 Attention: Focused;Sustained;Selective;Alternating;Divided Focused Attention: Appears intact Sustained Attention: Appears intact Selective Attention: Appears intact Alternating Attention: Appears intact Divided Attention: Appears intact Memory: Appears intact Awareness: Appears intact Problem Solving: Appears intact Safety/Judgment: Appears intact Sensation Sensation Light Touch: Appears Intact Stereognosis: Appears Intact Hot/Cold: Appears Intact Proprioception: Appears Intact Coordination Gross Motor Movements are Fluid and Coordinated: Yes Fine Motor Movements are Fluid and Coordinated: Yes Coordination and Movement Description: antalgic movements due to L hip/pelvis pain and NWB/TTWB status Finger Nose Finger Test: wfl bilateral UEs  Heel Shin Test: unable to complete either side due to pelvic pain Motor  Motor Motor: Within Functional Limits Motor - Discharge Observations: antalgic movements due to pelvis/L hip pain Mobility    see function section of chart  Trunk/Postural Assessment  Cervical Assessment Cervical Assessment: Within Functional  Limits Thoracic Assessment Thoracic Assessment: Within Functional Limits Lumbar Assessment Lumbar Assessment: Within Functional Limits   Postural Control Postural Control: Within Functional Limits  Balance Balance Balance Assessed: Yes Static Sitting Balance Static Sitting - Balance Support: No upper extremity supported;Feet supported Static Sitting - Level of Assistance: 7: Independent Dynamic Sitting Balance Dynamic Sitting - Balance Support: Feet supported;Left upper extremity supported;Right upper extremity supported Dynamic Sitting - Level of Assistance: 6: Modified independent (Device/Increase time) Static Standing Balance Static Standing - Balance Support: Bilateral upper extremity supported;During functional activity Static Standing - Level of Assistance: 6: Modified independent (Device/Increase time) Dynamic Standing Balance Dynamic Standing - Balance Support: Bilateral upper extremity supported;During functional activity Dynamic Standing - Level of Assistance: 6: Modified independent (Device/Increase time) Extremity/Trunk Assessment RUE Assessment RUE Assessment: Within Functional Limits LUE Assessment LUE Assessment: Within Functional Limits  Function:   Eating Eating   Eating Assist Level: No help, No cues           Grooming Oral Care,Brush Teeth, Clean Dentures Activity:      Assist Level: Assistive device Assistive Device Comment: wheelchair    Wash, Rinse, Dry Face Activity   Assist Level: Assistive device Assistive Device Comment: wheelchair     Wash, Rinse, Dry Hands Activity   Assist Level: Assistive device Assistive Device Comment: wheelchair    Brush, Comb Hair Activity   Assist Level: Assistive device Assistive Device Comment: wheelchair  Shave Activity          Apply Makeup Activity                                                             Bathing Bathing position   Position: Shower  Bathing parts Body parts bathed by patient:  Right arm;Left arm;Chest;Abdomen;Front perineal area;Right upper leg;Left upper leg;Buttocks;Right lower leg;Left lower leg;Back    Bathing assist Assist Level: Supervision or verbal cues       Upper Body Dressing/Undressing Upper body dressing   What is the patient wearing?: Pull over shirt/dress     Pull over shirt/dress - Perfomed by patient: Thread/unthread right sleeve;Thread/unthread left sleeve;Put head through opening;Pull shirt over trunk          Upper body assist Assist Level: Assistive device Assistive Device Comment: wheelchair     Lower Body Dressing/Undressing Lower body dressing   What is the patient wearing?: Pants;Underwear;Socks;Shoes Underwear - Performed by patient: Thread/unthread right underwear leg;Thread/unthread left underwear leg;Pull underwear up/down   Pants- Performed by patient: Thread/unthread right pants leg;Thread/unthread left pants leg;Pull pants up/down;Fasten/unfasten pants       Socks - Performed by patient: Don/doff right sock;Don/doff left sock   Shoes - Performed by patient: Don/doff right shoe (Pt does not wear shoe on the left foot in order to help maintain NWBing status)            Lower body assist Assist Level: Assistive device Assistive Device Comment: wheelchair and RW     Toileting Toileting   Toileting steps completed by patient: Adjust clothing prior to toileting;Performs perineal hygiene;Adjust clothing after toileting      Toileting assist Assist level: More than reasonable time    Bed Mobility Roll left and right activity      Sit to lying activity      Lying to sitting activity   Assist level: No help, No cues, assistive device, takes more than a reasonable amount of  time  Mobility details     Transfers Sit to stand transfer   Sit to stand assist level: No help, no cues, assistive device, takes more than a reasonable amount of time Sit to stand assistive device: Walker;Armrests  Chair/bed transfer               Toilet transfer   Toilet transfer assistive device: Walker;Elevated toilet seat/BSC over toilet       Assist level to toilet: No Help, no cues, assistive device, takes more than a reasonable amount of time Assist level from toilet: No Help, no cues, assistive device, takes more than a reasonable amount of time  Tub/shower transfer   Tub/shower assistive device: Tub transfer bench;Grab bars;Walk in shower   Assist level into tub: Supervision or verbal cues Assist level out of tub: Supervision or verbal cues   Cognition Comprehension Comprehension assist level: Follows complex conversation/direction with no assist  Expression Expression assist level: Expresses basis less than 25% of the time/requires cueing >75% of the time.  Social Interaction Social Interaction assist level: Interacts appropriately with others - No medications needed.  Problem Solving Problem solving assist level: Solves complex problems: Recognizes & self-corrects  Memory Memory assist level: Complete Independence: No helper    MCGUIRE,Sadrac OTR/L 05/08/2015, 1:39 PM  

## 2015-05-08 NOTE — Progress Notes (Signed)
Social Work Discharge Note Discharge Note  The overall goal for the admission was met for:   Discharge location: Yes-HOME WITH DAD AND TONYA-DAD'S FINANCE-ALMOST 24 HR SUPERVISION  Length of Stay: Yes-8 DAYS  Discharge activity level: Yes-MOD/I WHEELCHAIR LEVEL DUE TO WB ISSUES  Home/community participation: Yes  Services provided included: MD, RD, PT, OT, RN, CM, TR, Pharmacy, Neuropsych and SW  Financial Services: Private Insurance: [facility]  Follow-up services arranged: Home Health: [facility] HOME HEALTH-PT,OT,RN, DME: [place], [place] and Patient/Family has no preference for HH/DME agencies  Comments (or additional information):FINALLY DECIDED TO GO TO DAD'S HOME MORE ACCESSIBLE-AMBULANCE TO GET UP FIVE STEPS AND WORK FROM THERE. SHORT TERM-WB ISSUES TONYA AND AMBER-SISTER TO ASSIST WITH CARE.  Patient/Family verbalized understanding of follow-up arrangements: Yes  Individual responsible for coordination of the follow-up plan: SELF & TONYA-DAD'S FINANCE  Confirmed correct DME delivered: Elease Hashimoto 05/08/2015    Elease Hashimoto

## 2015-05-08 NOTE — Progress Notes (Signed)
ANTICOAGULATION CONSULT NOTE - Follow Up Consult  Pharmacy Consult for coumadin Indication: VTE prophylaxis  No Known Allergies  Patient Measurements: Height:  (167.6 cm) Weight: 258 lb 8 oz (117.255 kg) IBW/kg (Calculated) : 63.8 Heparin Dosing Weight:   Vital Signs: Temp: 98.2 F (36.8 C) (08/25 0526) Temp Source: Oral (08/25 0526) BP: 118/57 mmHg (08/25 0526) Pulse Rate: 86 (08/25 0526)  Labs:  Recent Labs  05/06/15 0635 05/07/15 0540 05/08/15 0611  LABPROT 21.9* 22.2* 23.0*  INR 1.93* 1.96* 2.05*    Estimated Creatinine Clearance: 178.2 mL/min (by C-G formula based on Cr of 0.81).   Medications:  Scheduled:  . enoxaparin (LOVENOX) injection  60 mg Subcutaneous Q24H  . feeding supplement (ENSURE ENLIVE)  237 mL Oral BID BM  . methocarbamol  750 mg Oral TID  . senna-docusate  2 tablet Oral BID  . traMADol  100 mg Oral 4 times per day  . Warfarin - Physician Dosing Inpatient   Does not apply q1800   Infusions:    Assessment: 19 yo male is currently on therapeutic coumadin for VTE prophylaxis.  INR today is 2.05.  Goal of Therapy:  INR 2-3 Monitor platelets by anticoagulation protocol: Yes   Plan:  - Coumadin 10 mg x 1 dose tonight  - Daily INR while on Coumadin  - d/c lovenox   Alecia Doi, [name] 05/08/2015,8:13 AM

## 2015-05-08 NOTE — Progress Notes (Signed)
Pt discharged with assistance of EMS. Discharge instructions provided by Marissa Nestle, PA. All questions answered, pt verbalized understanding. Family friend in room to assist with transporting of equipment and personal belonging to discharge address.  Scheduled medication given.

## 2015-05-08 NOTE — Progress Notes (Signed)
Social Work Patient ID: Mathew Norman, male   DOB: 09-15-1995, 19 y.o.   MRN: [id number] Met with pt, dad and dad's girlfriend to discuss plan. Now will go to dad's home via ambulance to get up five steps. All informed again he does not have coverage to go to a NH or stay here longer, due to has reached his goals here.  Pt to get one of his friend's to take his bed frame down and place his mattresses on the floor. All agreeable to this plan. Can not afford to have ramp built and only for a short time. Pt to make some calls and family aware of the need to take wheelchair and  Rolling walker home, due to can not be placed on the ambulance. See in a few hours time pt wants set up for ambulance.
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
## 2015-05-08 NOTE — Progress Notes (Signed)
Physical Therapy Discharge Summary  Patient Details  Name: Mathew Norman MRN: [id number] Date of Birth: [birth year]  Today's Date: 05/08/2015 PT Individual Time: 0900-1000 PT Individual Time Calculation (min): 60 min    Patient has met 8 of 8 long term goals due to improved activity tolerance, improved balance, increased strength, increased range of motion, decreased pain, ability to compensate for deficits and improved coordination.  Patient to discharge at a wheelchair level Modified Independent.   Patient's care partner is independent to provide the necessary physical assistance at discharge.   Recommendation:  Patient will benefit from ongoing skilled PT services in home health setting to continue to advance safe functional mobility, address ongoing impairments in ambulatory status as pt's precautions are lifted and home safety evaluation, and minimize fall risk.  Equipment: 22" wide rental w/c , basic cushions for seat and back; wide RW  Reasons for discharge: treatment goals met and discharge from hospital  Patient/family agrees with progress made and goals achieved: Yes (Pt is ready to d/c home. Family does not understand that pt is completely mod I except for entering/exiting the home due to stairs and wish for pt to d/c to continued subacute rehab. OT spoke with sister and explained pt's functional level to her.)  Therapeutic Interventions: Pt received up in w/c after OT session - agreeable to PT. Community Integration - see below. Pt completes w/c propulsion/management in small spaces in gift shop, purchasing hip kit, negotiating elevator and steep ramp all mod I. Therapeutic Activity - see below - pt is mod I for all mobility. Therapeutic Exercise - Pt practices HEP (ankle pumps, isometric knee extension, heel slides, SAQ, supine hip abduction/adduction, LAQ: x 10 reps each leg) and PT gives pt cues such as using sheet for AAROM for supine hip abduction/adduction (more difficult  than seated hip ER/IR). No family present for family training during AM session. Family training bumping pt up/down stairs had been initiated with 1 family member during prior OT session. No further skilled PT needs in IPR setting at this time.    PT Discharge Precautions/Restrictions TDWB only for transfers LLE, no ambulation at this time Precautions Precautions: Fall Restrictions Weight Bearing Restrictions: Yes LLE Weight Bearing: Non weight bearing Other Position/Activity Restrictions: MD states TDWB for transfers only, otherwise NWB, No ambulation  Pain Pain Assessment Pain Assessment: 0-10 Pain Score: 6  Pain Type: Acute pain;Surgical pain Pain Location: Pelvis Pain Orientation: Left Pain Descriptors / Indicators: Aching Pain Onset: On-going Pain Intervention(s): Rest Multiple Pain Sites: No Cognition Overall Cognitive Status: Within Functional Limits for tasks assessed Arousal/Alertness: Awake/alert Orientation Level: Oriented X4 Attention: Focused;Sustained;Selective;Alternating;Divided Focused Attention: Appears intact Sustained Attention: Appears intact Selective Attention: Appears intact Alternating Attention: Appears intact Divided Attention: Appears intact Memory: Appears intact Awareness: Appears intact Problem Solving: Appears intact Safety/Judgment: Appears intact Sensation Sensation Light Touch: Appears Intact Stereognosis: Not tested Hot/Cold: Not tested Proprioception: Appears Intact Coordination Gross Motor Movements are Fluid and Coordinated: No Coordination and Movement Description: antalgic movements due to L hip/pelvis pain and NWB/TTWB status Finger Nose Finger Test: wfl bilateral UEs  Heel Shin Test: unable to complete either side due to pelvic pain Motor  Motor Motor: Within Functional Limits Motor - Discharge Observations: antalgic movements due to pelvis/L hip pain  Mobility Bed Mobility Bed Mobility: Rolling Right;Rolling Left;Supine  to Sit;Sit to Supine Rolling Right: 6: Modified independent (Device/Increase time) (pt avoids due to pain) Rolling Left: 6: Modified independent (Device/Increase time) (pt avoids due to pain) Supine to Sit:  6: Modified independent (Device/Increase time) (very slow) Sit to Supine: 6: Modified independent (Device/Increase time) (very slow) Locomotion  Ambulation Ambulation: No Gait Gait: No Stairs / Additional Locomotion Stairs: No Wheelchair Mobility Wheelchair Mobility: Yes Wheelchair Assistance: 6: Modified independent (Device/Increase time) Environmental health practitioner: Both upper extremities Wheelchair Parts Management: Independent Distance: 300  Trunk/Postural Assessment  Cervical Assessment Cervical Assessment: Within Functional Limits Thoracic Assessment Thoracic Assessment: Within Functional Limits Lumbar Assessment Lumbar Assessment: Within Functional Limits Postural Control Postural Control: Within Functional Limits  Balance Balance Balance Assessed: Yes Static Sitting Balance Static Sitting - Balance Support: No upper extremity supported;Feet supported Static Sitting - Level of Assistance: 7: Independent Dynamic Sitting Balance Dynamic Sitting - Balance Support: Feet supported;Left upper extremity supported;Right upper extremity supported Dynamic Sitting - Level of Assistance: 6: Modified independent (Device/Increase time) Sitting balance - Comments: manipulation of w/c parts from w/c Static Standing Balance Static Standing - Balance Support: Bilateral upper extremity supported;During functional activity Static Standing - Level of Assistance: 6: Modified independent (Device/Increase time) Dynamic Standing Balance Dynamic Standing - Balance Support: Bilateral upper extremity supported;During functional activity Dynamic Standing - Level of Assistance: 6: Modified independent (Device/Increase time) Extremity Assessment  RUE Assessment RUE Assessment: Within Functional  Limits LUE Assessment LUE Assessment: Within Functional Limits RLE Assessment RLE Assessment: Exceptions to [facility] RLE AROM (degrees) Overall AROM Right Lower Extremity: Deficits;Due to pain;Due to decreased strength RLE Overall AROM Comments: hip flexion limited due to pain; ankle and knee WNL RLE Strength RLE Overall Strength: Deficits;Due to pain RLE Overall Strength Comments: WFL for stand pivot transfer LLE Assessment LLE Assessment: Exceptions to WFL LLE AROM (degrees) Overall AROM Left Lower Extremity: Deficits;Due to decreased strength;Due to pain LLE Overall AROM Comments: limited due to pain LLE Strength LLE Overall Strength: Deficits;Due to pain LLE Overall Strength Comments: grossly in sitting: hip flex 2+/5, knee ext and ankle DF at least 3/5, in supine hip abd 2/3, hip add 2-/5  Function:  Toileting Toileting       Bed Mobility Roll left and right activity Roll left and right activity did not occur: Refused (due to pain)    Sit to lying activity   Assist level: No help, No cues, assistive device, takes more than a reasonable amount of time  Lying to sitting activity   Assist level: No help, No cues, assistive device, takes more than a reasonable amount of time  Mobility details     Transfers Sit to stand transfer   Sit to stand assist level: No help, no cues, assistive device, takes more than a reasonable amount of time Sit to stand assistive device: Walker;Armrests  Chair/bed transfer   Chair/bed transfer method: Stand pivot Chair/bed transfer assist level: No Help, no cues, assistive device, takes more than a reasonable amount of time Chair/bed transfer assistive device: Games developer transfer assistive device: Publishing rights manager transfer assist level: Supervision or verbal cues    Locomotion Ambulation Ambulation activity did not occur: Safety/medical concerns (contraindicated due to precautions)         Walk 10 feet activity      Walk 50 feet with 2 turns activity      Walk 150 feet activity      Walk 10 feet on uneven surfaces activity      Stairs Stairs activity did not occur: Safety/medical concerns (contraindicated due to precautions)  Walk up/down 1 step activity   Stairs activity did not occur: Safety/medical concerns (contraindicated due to precautions)    Walk up/down 4 steps activity      Walk up/down 12 steps activity      Pick up small objects from floor      Wheelchair   Type: Manual Max wheelchair distance: >500 Assist Level: No help, No cues, assistive device, takes more than reasonable amount of time  Wheel 50 feet with 2 turns activity   Assist Level: No help, No cues, assistive device, takes more than reasonable amount of time  Wheel 150 feet activity   Assist Level: No help, No cues, assistive device, takes more than reasonable amount of time   Cognition Comprehension Comprehension assist level: Follows complex conversation/direction with no assist  Expression Expression assist level: Expresses complex ideas: With no assist  Social Interaction Social Interaction assist level: Interacts appropriately with others - No medications needed.  Problem Solving Problem solving assist level: Solves complex problems: Recognizes & self-corrects  Memory Memory assist level: Complete Independence: No helper    Malaiyah Achorn M 05/08/2015, 9:49 AM
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
## 2015-05-08 NOTE — Progress Notes (Signed)
Social Work Patient ID: Mathew Norman, male   DOB: December 07, 1995, 19 y.o.   MRN: [id number] Spoke with Tonya-dad's girlfriend to discuss SNF is not an option and the recommendation is a ramp until he can weight bear on his legs.  Offered the option to ambulance him home but that does not solve the follow up appointments getting back and forth. Will see when here. He has met the rehab goals and his stay can not be extended either.

## 2015-05-08 NOTE — Progress Notes (Signed)
Social Work Patient ID: Mathew Norman, male   DOB: [birth year], 19 y.o.   MRN: [id number] Sister-Amber to come and get rolling walker and wheelchair to take to the home, pt is set up with ambulance home at 4;30-5;00 pm. Pt aware and have contacted Tonya-dad's finance to make aware-dad's therapy goes until 4;15 pm today.  Bedside commode to be delivered to pt's home today. Pt is ready to go home and hopeful it will go well there.

## 2015-05-12 ENCOUNTER — Telehealth: Payer: Self-pay | Admitting: *Deleted

## 2015-05-12 NOTE — Telephone Encounter (Signed)
Calling for approval of tx plan for HHPT 3wk1,2wk4.  Approval given

## 2015-05-26 ENCOUNTER — Encounter (HOSPITAL_COMMUNITY): Payer: Self-pay

## 2015-05-26 ENCOUNTER — Inpatient Hospital Stay (HOSPITAL_COMMUNITY)
Admission: EM | Admit: 2015-05-26 | Discharge: 2015-06-19 | DRG: 463 | Disposition: A | Discharge status: Home / Self Care | Payer: Commercial Managed Care - HMO | Attending: Orthopedic Surgery | Admitting: Orthopedic Surgery

## 2015-05-26 ENCOUNTER — Emergency Department (HOSPITAL_COMMUNITY): Payer: Commercial Managed Care - HMO

## 2015-05-26 DIAGNOSIS — R502 Drug induced fever: Secondary | ICD-10-CM | POA: Diagnosis not present

## 2015-05-26 DIAGNOSIS — M8618 Other acute osteomyelitis, other site: Secondary | ICD-10-CM | POA: Diagnosis not present

## 2015-05-26 DIAGNOSIS — N390 Urinary tract infection, site not specified: Secondary | ICD-10-CM | POA: Diagnosis not present

## 2015-05-26 DIAGNOSIS — Z79899 Other long term (current) drug therapy: Secondary | ICD-10-CM | POA: Diagnosis not present

## 2015-05-26 DIAGNOSIS — T814XXA Infection following a procedure, initial encounter: Secondary | ICD-10-CM | POA: Diagnosis present

## 2015-05-26 DIAGNOSIS — I10 Essential (primary) hypertension: Secondary | ICD-10-CM

## 2015-05-26 DIAGNOSIS — Y831 Surgical operation with implant of artificial internal device as the cause of abnormal reaction of the patient, or of later complication, without mention of misadventure at the time of the procedure: Secondary | ICD-10-CM | POA: Diagnosis present

## 2015-05-26 DIAGNOSIS — B964 Proteus (mirabilis) (morganii) as the cause of diseases classified elsewhere: Secondary | ICD-10-CM | POA: Diagnosis not present

## 2015-05-26 DIAGNOSIS — S71112D Laceration without foreign body, left thigh, subsequent encounter: Secondary | ICD-10-CM | POA: Diagnosis not present

## 2015-05-26 DIAGNOSIS — Z6841 Body Mass Index (BMI) 40.0 and over, adult: Secondary | ICD-10-CM | POA: Diagnosis not present

## 2015-05-26 DIAGNOSIS — L02416 Cutaneous abscess of left lower limb: Secondary | ICD-10-CM | POA: Diagnosis not present

## 2015-05-26 DIAGNOSIS — N289 Disorder of kidney and ureter, unspecified: Secondary | ICD-10-CM

## 2015-05-26 DIAGNOSIS — Z833 Family history of diabetes mellitus: Secondary | ICD-10-CM

## 2015-05-26 DIAGNOSIS — E559 Vitamin D deficiency, unspecified: Secondary | ICD-10-CM | POA: Diagnosis present

## 2015-05-26 DIAGNOSIS — T8131XA Disruption of external operation (surgical) wound, not elsewhere classified, initial encounter: Secondary | ICD-10-CM | POA: Diagnosis present

## 2015-05-26 DIAGNOSIS — R7303 Prediabetes: Secondary | ICD-10-CM | POA: Diagnosis present

## 2015-05-26 DIAGNOSIS — B954 Other streptococcus as the cause of diseases classified elsewhere: Secondary | ICD-10-CM | POA: Diagnosis present

## 2015-05-26 DIAGNOSIS — E611 Iron deficiency: Secondary | ICD-10-CM | POA: Diagnosis present

## 2015-05-26 DIAGNOSIS — E291 Testicular hypofunction: Secondary | ICD-10-CM | POA: Diagnosis present

## 2015-05-26 DIAGNOSIS — S329XXG Fracture of unspecified parts of lumbosacral spine and pelvis, subsequent encounter for fracture with delayed healing: Secondary | ICD-10-CM | POA: Diagnosis not present

## 2015-05-26 DIAGNOSIS — D62 Acute posthemorrhagic anemia: Secondary | ICD-10-CM | POA: Diagnosis not present

## 2015-05-26 DIAGNOSIS — M869 Osteomyelitis, unspecified: Secondary | ICD-10-CM

## 2015-05-26 DIAGNOSIS — R51 Headache: Secondary | ICD-10-CM

## 2015-05-26 DIAGNOSIS — T8469XA Infection and inflammatory reaction due to internal fixation device of other site, initial encounter: Secondary | ICD-10-CM | POA: Diagnosis present

## 2015-05-26 DIAGNOSIS — R509 Fever, unspecified: Secondary | ICD-10-CM | POA: Diagnosis not present

## 2015-05-26 DIAGNOSIS — K651 Peritoneal abscess: Secondary | ICD-10-CM | POA: Diagnosis present

## 2015-05-26 DIAGNOSIS — S32810D Multiple fractures of pelvis with stable disruption of pelvic ring, subsequent encounter for fracture with routine healing: Secondary | ICD-10-CM | POA: Diagnosis not present

## 2015-05-26 DIAGNOSIS — Z9889 Other specified postprocedural states: Secondary | ICD-10-CM | POA: Diagnosis not present

## 2015-05-26 DIAGNOSIS — N739 Female pelvic inflammatory disease, unspecified: Secondary | ICD-10-CM | POA: Insufficient documentation

## 2015-05-26 DIAGNOSIS — N179 Acute kidney failure, unspecified: Secondary | ICD-10-CM | POA: Diagnosis present

## 2015-05-26 DIAGNOSIS — S32402D Unspecified fracture of left acetabulum, subsequent encounter for fracture with routine healing: Secondary | ICD-10-CM | POA: Diagnosis not present

## 2015-05-26 DIAGNOSIS — M868X8 Other osteomyelitis, other site: Secondary | ICD-10-CM | POA: Diagnosis present

## 2015-05-26 DIAGNOSIS — IMO0001 Reserved for inherently not codable concepts without codable children: Secondary | ICD-10-CM

## 2015-05-26 DIAGNOSIS — A491 Streptococcal infection, unspecified site: Secondary | ICD-10-CM | POA: Insufficient documentation

## 2015-05-26 DIAGNOSIS — R519 Headache, unspecified: Secondary | ICD-10-CM | POA: Insufficient documentation

## 2015-05-26 DIAGNOSIS — T368X5A Adverse effect of other systemic antibiotics, initial encounter: Secondary | ICD-10-CM | POA: Diagnosis not present

## 2015-05-26 DIAGNOSIS — T8149XA Infection following a procedure, other surgical site, initial encounter: Secondary | ICD-10-CM | POA: Insufficient documentation

## 2015-05-26 DIAGNOSIS — S41111D Laceration without foreign body of right upper arm, subsequent encounter: Secondary | ICD-10-CM | POA: Diagnosis not present

## 2015-05-26 DIAGNOSIS — Z7901 Long term (current) use of anticoagulants: Secondary | ICD-10-CM

## 2015-05-26 DIAGNOSIS — B9689 Other specified bacterial agents as the cause of diseases classified elsewhere: Secondary | ICD-10-CM | POA: Diagnosis not present

## 2015-05-26 HISTORY — DX: Vitamin D deficiency, unspecified: E55.9

## 2015-05-26 HISTORY — DX: Osteomyelitis, unspecified: M86.9

## 2015-05-26 HISTORY — DX: Iron deficiency: E61.1

## 2015-05-26 LAB — URINALYSIS, ROUTINE W REFLEX MICROSCOPIC
Bilirubin Urine: NEGATIVE
Glucose, UA: NEGATIVE mg/dL
Hgb urine dipstick: NEGATIVE
KETONES UR: 15 mg/dL — AB
LEUKOCYTES UA: NEGATIVE
NITRITE: NEGATIVE
PROTEIN: 30 mg/dL — AB
Specific Gravity, Urine: 1.036 — ABNORMAL HIGH (ref 1.005–1.030)
UROBILINOGEN UA: 1 mg/dL (ref 0.0–1.0)
pH: 6 (ref 5.0–8.0)

## 2015-05-26 LAB — C-REACTIVE PROTEIN: CRP: 16.8 mg/dL — ABNORMAL HIGH (ref <1.0)

## 2015-05-26 LAB — URINE MICROSCOPIC-ADD ON

## 2015-05-26 LAB — I-STAT CG4 LACTIC ACID, ED
LACTIC ACID, VENOUS: 0.95 mmol/L (ref 0.5–2.0)
LACTIC ACID, VENOUS: 0.54 mmol/L (ref 0.5–2.0)

## 2015-05-26 LAB — SEDIMENTATION RATE: Sed Rate: 75 mm/hr — ABNORMAL HIGH (ref 0–16)

## 2015-05-26 MED ORDER — ACETAMINOPHEN 325 MG PO TABS: 650.0000 mg | ORAL_TABLET | Freq: Four times a day (QID) | ORAL | Status: DC | PRN

## 2015-05-26 MED ORDER — DOCUSATE SODIUM 100 MG PO CAPS
100.0000 mg | ORAL_CAPSULE | Freq: Two times a day (BID) | ORAL | Status: DC
  Administered 2015-05-26: 100 mg via ORAL
  Filled 2015-05-26: qty 1

## 2015-05-26 MED ORDER — HYDROMORPHONE HCL 1 MG/ML IJ SOLN
0.5000 mg | INTRAMUSCULAR | Status: DC | PRN
  Administered 2015-05-26: 1 mg via INTRAVENOUS
  Filled 2015-05-26: qty 1

## 2015-05-26 MED ORDER — IOHEXOL 300 MG/ML  SOLN
100.0000 mL | Freq: Once | INTRAMUSCULAR | Status: AC | PRN
  Administered 2015-05-26: 100 mL via INTRAVENOUS

## 2015-05-26 MED ORDER — OXYCODONE HCL 5 MG PO TABS: 5.0000 mg | ORAL_TABLET | ORAL | Status: DC | PRN

## 2015-05-26 MED ORDER — INFLUENZA VAC SPLIT QUAD 0.5 ML IM SUSY
0.5000 mL | PREFILLED_SYRINGE | INTRAMUSCULAR | Status: AC
  Administered 2015-05-28: 0.5 mL via INTRAMUSCULAR
  Filled 2015-05-26 (×2): qty 0.5

## 2015-05-26 MED ORDER — IOHEXOL 300 MG/ML  SOLN
25.0000 mL | Freq: Once | INTRAMUSCULAR | Status: AC | PRN
  Administered 2015-05-26: 25 mL via ORAL

## 2015-05-26 MED ORDER — ACETAMINOPHEN 650 MG RE SUPP
650.0000 mg | Freq: Four times a day (QID) | RECTAL | Status: DC | PRN
Start: 2015-05-26 — End: 2015-05-27

## 2015-05-26 MED ORDER — SODIUM CHLORIDE 0.9 % IV BOLUS (SEPSIS)
1000.0000 mL | Freq: Once | INTRAVENOUS | Status: AC
  Administered 2015-05-26: 1000 mL via INTRAVENOUS

## 2015-05-26 MED ORDER — IOHEXOL 300 MG/ML  SOLN: 100.0000 mL | Freq: Once | INTRAMUSCULAR | Status: DC | PRN

## 2015-05-26 MED ORDER — ONDANSETRON HCL 4 MG PO TABS: 4.0000 mg | ORAL_TABLET | Freq: Four times a day (QID) | ORAL | Status: DC | PRN

## 2015-05-26 MED ORDER — ONDANSETRON HCL 4 MG/2ML IJ SOLN: 4.0000 mg | Freq: Four times a day (QID) | INTRAMUSCULAR | Status: DC | PRN

## 2015-05-26 MED ORDER — ALUM & MAG HYDROXIDE-SIMETH 200-200-20 MG/5ML PO SUSP
30.0000 mL | Freq: Four times a day (QID) | ORAL | Status: DC | PRN
Start: 2015-05-26 — End: 2015-05-27

## 2015-05-26 MED ORDER — LACTATED RINGERS IV SOLN
INTRAVENOUS | Status: DC
  Administered 2015-05-26 – 2015-05-27 (×2): via INTRAVENOUS

## 2015-05-26 MED ORDER — CHLORHEXIDINE GLUCONATE 4 % EX LIQD
60.0000 mL | Freq: Once | CUTANEOUS | Status: DC
  Filled 2015-05-26: qty 60
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
## 2015-05-26 NOTE — ED Notes (Signed)
Lunch tray has arrived and patient is eating his meal
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
## 2015-05-26 NOTE — ED Notes (Signed)
Patient brought to room by Elaina Hoops, RN; Denny Peon, RN present in room

## 2015-05-27 ENCOUNTER — Encounter (HOSPITAL_COMMUNITY)
Admission: EM | Disposition: A | Discharge status: Home / Self Care | Payer: Self-pay | Source: Home / Self Care | Attending: Orthopedic Surgery

## 2015-05-27 ENCOUNTER — Inpatient Hospital Stay (HOSPITAL_COMMUNITY): Payer: Commercial Managed Care - HMO | Admitting: Certified Registered"

## 2015-05-27 ENCOUNTER — Encounter (HOSPITAL_COMMUNITY): Payer: Self-pay | Admitting: Certified Registered Nurse Anesthetist

## 2015-05-27 HISTORY — PX: APPLICATION OF WOUND VAC: SHX5189

## 2015-05-27 HISTORY — PX: INCISION AND DRAINAGE HIP: SHX1801

## 2015-05-27 LAB — CBC WITH DIFFERENTIAL/PLATELET
BASOS ABS: 0 10*3/uL (ref 0.0–0.1)
BASOS ABS: 0 10*3/uL (ref 0.0–0.1)
BASOS PCT: 0 % (ref 0–1)
BASOS PCT: 0 % (ref 0–1)
EOS ABS: 0.1 10*3/uL (ref 0.0–0.7)
EOS PCT: 4 % (ref 0–5)
EOS PCT: 1 % (ref 0–5)
Eosinophils Absolute: 0.3 10*3/uL (ref 0.0–0.7)
HCT: 37.2 % — ABNORMAL LOW (ref 39.0–52.0)
HEMATOCRIT: 31.7 % — AB (ref 39.0–52.0)
HEMOGLOBIN: 12.3 g/dL — AB (ref 13.0–17.0)
Hemoglobin: 10.5 g/dL — ABNORMAL LOW (ref 13.0–17.0)
LYMPHS ABS: 1.4 10*3/uL (ref 0.7–4.0)
LYMPHS PCT: 21 % (ref 12–46)
Lymphocytes Relative: 14 % (ref 12–46)
Lymphs Abs: 1.5 10*3/uL (ref 0.7–4.0)
MCH: 28.4 pg (ref 26.0–34.0)
MCH: 27 pg (ref 26.0–34.0)
MCHC: 33.1 g/dL (ref 30.0–36.0)
MCHC: 33.1 g/dL (ref 30.0–36.0)
MCV: 85.7 fL (ref 78.0–100.0)
MCV: 81.8 fL (ref 78.0–100.0)
Monocytes Absolute: 0.7 10*3/uL (ref 0.1–1.0)
Monocytes Absolute: 1.2 10*3/uL — ABNORMAL HIGH (ref 0.1–1.0)
Monocytes Relative: 10 % (ref 3–12)
Monocytes Relative: 12 % (ref 3–12)
NEUTROS ABS: 4.5 10*3/uL (ref 1.7–7.7)
NEUTROS PCT: 73 % (ref 43–77)
Neutro Abs: 7 10*3/uL (ref 1.7–7.7)
Neutrophils Relative %: 65 % (ref 43–77)
PLATELETS: 339 10*3/uL (ref 150–400)
PLATELETS: 250 10*3/uL (ref 150–400)
RBC: 3.7 MIL/uL — AB (ref 4.22–5.81)
RBC: 4.55 MIL/uL (ref 4.22–5.81)
RDW: 13.3 % (ref 11.5–15.5)
RDW: 12.2 % (ref 11.5–15.5)
WBC: 7 10*3/uL (ref 4.0–10.5)
WBC: 9.6 10*3/uL (ref 4.0–10.5)

## 2015-05-27 LAB — COMPREHENSIVE METABOLIC PANEL
ALBUMIN: 2.5 g/dL — AB (ref 3.5–5.0)
ALT: 35 U/L (ref 17–63)
ALT: 19 U/L (ref 17–63)
ALT: 16 U/L — ABNORMAL LOW (ref 17–63)
AST: 39 U/L (ref 15–41)
AST: 23 U/L (ref 15–41)
AST: 21 U/L (ref 15–41)
Albumin: 3.2 g/dL — ABNORMAL LOW (ref 3.5–5.0)
Albumin: 2.7 g/dL — ABNORMAL LOW (ref 3.5–5.0)
Alkaline Phosphatase: 52 U/L (ref 38–126)
Alkaline Phosphatase: 71 U/L (ref 38–126)
Alkaline Phosphatase: 62 U/L (ref 38–126)
Anion gap: 7 (ref 5–15)
Anion gap: 12 (ref 5–15)
Anion gap: 8 (ref 5–15)
BILIRUBIN TOTAL: 0.4 mg/dL (ref 0.3–1.2)
BILIRUBIN TOTAL: 0.7 mg/dL (ref 0.3–1.2)
BUN: 8 mg/dL (ref 6–20)
BUN: 8 mg/dL (ref 6–20)
BUN: 7 mg/dL (ref 6–20)
CHLORIDE: 99 mmol/L — AB (ref 101–111)
CHLORIDE: 99 mmol/L — AB (ref 101–111)
CO2: 29 mmol/L (ref 22–32)
CO2: 25 mmol/L (ref 22–32)
CO2: 28 mmol/L (ref 22–32)
CREATININE: 0.81 mg/dL (ref 0.61–1.24)
CREATININE: 0.74 mg/dL (ref 0.61–1.24)
CREATININE: 0.78 mg/dL (ref 0.61–1.24)
Calcium: 8.9 mg/dL (ref 8.9–10.3)
Calcium: 9.2 mg/dL (ref 8.9–10.3)
Calcium: 9 mg/dL (ref 8.9–10.3)
Chloride: 102 mmol/L (ref 101–111)
GFR calc Af Amer: >60 mL/min (ref >60)
GFR calc Af Amer: >60 mL/min (ref >60)
GLUCOSE: 104 mg/dL — AB (ref 65–99)
Glucose, Bld: 116 mg/dL — ABNORMAL HIGH (ref 65–99)
Glucose, Bld: 111 mg/dL — ABNORMAL HIGH (ref 65–99)
POTASSIUM: 4.1 mmol/L (ref 3.5–5.1)
POTASSIUM: 3.6 mmol/L (ref 3.5–5.1)
POTASSIUM: 4.1 mmol/L (ref 3.5–5.1)
Sodium: 135 mmol/L (ref 135–145)
Sodium: 136 mmol/L (ref 135–145)
Sodium: 138 mmol/L (ref 135–145)
TOTAL PROTEIN: 7.7 g/dL (ref 6.5–8.1)
TOTAL PROTEIN: 6.5 g/dL (ref 6.5–8.1)
Total Bilirubin: 0.2 mg/dL — ABNORMAL LOW (ref 0.3–1.2)
Total Protein: 6.4 g/dL — ABNORMAL LOW (ref 6.5–8.1)

## 2015-05-27 LAB — GLUCOSE, CAPILLARY
Glucose-Capillary: 98 mg/dL (ref 65–99)
Glucose-Capillary: 102 mg/dL — ABNORMAL HIGH (ref 65–99)

## 2015-05-27 LAB — CBC
HEMATOCRIT: 35.5 % — AB (ref 39.0–52.0)
HEMATOCRIT: 34 % — AB (ref 39.0–52.0)
HEMOGLOBIN: 11 g/dL — AB (ref 13.0–17.0)
Hemoglobin: 11.4 g/dL — ABNORMAL LOW (ref 13.0–17.0)
MCH: 26.8 pg (ref 26.0–34.0)
MCH: 26.9 pg (ref 26.0–34.0)
MCHC: 32.1 g/dL (ref 30.0–36.0)
MCHC: 32.4 g/dL (ref 30.0–36.0)
MCV: 83.5 fL (ref 78.0–100.0)
MCV: 83.1 fL (ref 78.0–100.0)
PLATELETS: 237 10*3/uL (ref 150–400)
Platelets: 239 10*3/uL (ref 150–400)
RBC: 4.25 MIL/uL (ref 4.22–5.81)
RBC: 4.09 MIL/uL — ABNORMAL LOW (ref 4.22–5.81)
RDW: 12.4 % (ref 11.5–15.5)
RDW: 12.3 % (ref 11.5–15.5)
WBC: 5.9 10*3/uL (ref 4.0–10.5)
WBC: 6.7 10*3/uL (ref 4.0–10.5)

## 2015-05-27 LAB — URINE CULTURE

## 2015-05-27 LAB — CREATININE, SERUM
Creatinine, Ser: 0.77 mg/dL (ref 0.61–1.24)
GFR calc non Af Amer: >60 mL/min (ref >60)

## 2015-05-27 LAB — APTT: APTT: 46 s — AB (ref 24–37)

## 2015-05-27 LAB — PROTIME-INR
INR: 2.48 — AB (ref 0.00–1.49)
PROTHROMBIN TIME: 26.5 s — AB (ref 11.6–15.2)

## 2015-05-27 LAB — HEMOGLOBIN A1C
Hgb A1c MFr Bld: 5.8 % — ABNORMAL HIGH (ref 4.8–5.6)
MEAN PLASMA GLUCOSE: 120 mg/dL

## 2015-05-27 SURGERY — IRRIGATION AND DEBRIDEMENT HIP
Anesthesia: General | Site: Pelvis

## 2015-05-27 MED ORDER — HYDROMORPHONE HCL 1 MG/ML IJ SOLN
INTRAMUSCULAR | Status: AC
  Filled 2015-05-27: qty 1

## 2015-05-27 MED ORDER — LACTATED RINGERS IV SOLN
INTRAVENOUS | Status: DC
  Administered 2015-05-27 – 2015-06-12 (×3): via INTRAVENOUS

## 2015-05-27 MED ORDER — METOCLOPRAMIDE HCL 5 MG PO TABS: 5.0000 mg | ORAL_TABLET | Freq: Three times a day (TID) | ORAL | Status: DC | PRN

## 2015-05-27 MED ORDER — TOBRAMYCIN SULFATE 1.2 G IJ SOLR
INTRAMUSCULAR | Status: AC
  Filled 2015-05-27: qty 1.2

## 2015-05-27 MED ORDER — OXYCODONE HCL 5 MG PO TABS
5.0000 mg | ORAL_TABLET | ORAL | Status: DC | PRN
  Administered 2015-05-27 – 2015-06-06 (×20): 10 mg via ORAL
  Filled 2015-05-27 (×21): qty 2

## 2015-05-27 MED ORDER — FENTANYL CITRATE (PF) 100 MCG/2ML IJ SOLN
INTRAMUSCULAR | Status: DC | PRN
  Administered 2015-05-27: 50 ug via INTRAVENOUS
  Administered 2015-05-27: 150 ug via INTRAVENOUS
  Administered 2015-05-27: 100 ug via INTRAVENOUS
  Administered 2015-05-27 (×4): 50 ug via INTRAVENOUS

## 2015-05-27 MED ORDER — ACETAMINOPHEN 160 MG/5ML PO SOLN
325.0000 mg | ORAL | Status: DC | PRN
  Filled 2015-05-27: qty 20.3

## 2015-05-27 MED ORDER — SODIUM CHLORIDE 0.9 % IR SOLN
Status: DC | PRN
Start: 2015-05-27 — End: 2015-05-27
  Administered 2015-05-27: 9000 mL

## 2015-05-27 MED ORDER — ACETAMINOPHEN 325 MG PO TABS
325.0000 mg | ORAL_TABLET | ORAL | Status: DC | PRN
Start: 2015-05-27 — End: 2015-05-27

## 2015-05-27 MED ORDER — METHOCARBAMOL 500 MG PO TABS
500.0000 mg | ORAL_TABLET | Freq: Four times a day (QID) | ORAL | Status: DC | PRN
  Administered 2015-05-27 – 2015-05-28 (×3): 1000 mg via ORAL
  Administered 2015-05-28: 500 mg via ORAL
  Administered 2015-05-30 – 2015-06-03 (×7): 1000 mg via ORAL
  Administered 2015-06-06: 500 mg via ORAL
  Administered 2015-06-07: 1000 mg via ORAL
  Administered 2015-06-10 (×2): 500 mg via ORAL
  Filled 2015-05-27: qty 2
  Filled 2015-05-27: qty 1
  Filled 2015-05-27 (×6): qty 2
  Filled 2015-05-27: qty 1
  Filled 2015-05-27 (×3): qty 2
  Filled 2015-05-27: qty 1
  Filled 2015-05-27: qty 2
  Filled 2015-05-27: qty 1

## 2015-05-27 MED ORDER — ONDANSETRON HCL 4 MG/2ML IJ SOLN
4.0000 mg | Freq: Four times a day (QID) | INTRAMUSCULAR | Status: DC | PRN
  Administered 2015-05-29 – 2015-06-18 (×5): 4 mg via INTRAVENOUS
  Filled 2015-05-27 (×2): qty 2

## 2015-05-27 MED ORDER — TOBRAMYCIN SULFATE 1.2 G IJ SOLR
INTRAMUSCULAR | Status: DC | PRN
  Administered 2015-05-27: 1.2 g

## 2015-05-27 MED ORDER — FENTANYL CITRATE (PF) 250 MCG/5ML IJ SOLN
INTRAMUSCULAR | Status: AC
  Filled 2015-05-27: qty 5

## 2015-05-27 MED ORDER — ENOXAPARIN SODIUM 40 MG/0.4ML ~~LOC~~ SOLN: 40.0000 mg | SUBCUTANEOUS | Status: DC

## 2015-05-27 MED ORDER — OXYCODONE HCL 5 MG/5ML PO SOLN: 5.0000 mg | Freq: Once | ORAL | Status: DC | PRN

## 2015-05-27 MED ORDER — ONDANSETRON HCL 4 MG PO TABS: 4.0000 mg | ORAL_TABLET | Freq: Four times a day (QID) | ORAL | Status: DC | PRN

## 2015-05-27 MED ORDER — POTASSIUM CHLORIDE IN NACL 20-0.9 MEQ/L-% IV SOLN
INTRAVENOUS | Status: DC
  Administered 2015-05-27 – 2015-05-30 (×3): via INTRAVENOUS
  Administered 2015-05-30: 100 mL/h via INTRAVENOUS
  Administered 2015-05-31 – 2015-06-01 (×3): via INTRAVENOUS
  Administered 2015-06-02: 1 mL via INTRAVENOUS
  Administered 2015-06-02 – 2015-06-15 (×17): via INTRAVENOUS
  Filled 2015-05-27 (×27): qty 1000

## 2015-05-27 MED ORDER — SUCCINYLCHOLINE CHLORIDE 20 MG/ML IJ SOLN
INTRAMUSCULAR | Status: DC | PRN
  Administered 2015-05-27: 60 mg via INTRAVENOUS

## 2015-05-27 MED ORDER — HYDROMORPHONE HCL 1 MG/ML IJ SOLN
0.5000 mg | INTRAMUSCULAR | Status: DC | PRN
  Administered 2015-05-27 – 2015-06-01 (×24): 1 mg via INTRAVENOUS
  Filled 2015-05-27 (×25): qty 1

## 2015-05-27 MED ORDER — ONDANSETRON HCL 4 MG/2ML IJ SOLN
INTRAMUSCULAR | Status: DC | PRN
  Administered 2015-05-27: 4 mg via INTRAVENOUS

## 2015-05-27 MED ORDER — INSULIN ASPART 100 UNIT/ML ~~LOC~~ SOLN
0.0000 [IU] | Freq: Three times a day (TID) | SUBCUTANEOUS | Status: DC
  Administered 2015-05-28 – 2015-06-13 (×11): 2 [IU] via SUBCUTANEOUS

## 2015-05-27 MED ORDER — PROPOFOL 10 MG/ML IV BOLUS
INTRAVENOUS | Status: DC | PRN
  Administered 2015-05-27: 50 mg via INTRAVENOUS
  Administered 2015-05-27: 180 mg via INTRAVENOUS
  Administered 2015-05-27: 50 mg via INTRAVENOUS
  Administered 2015-05-27: 20 mg via INTRAVENOUS

## 2015-05-27 MED ORDER — VANCOMYCIN HCL 500 MG IV SOLR
INTRAVENOUS | Status: DC | PRN
  Administered 2015-05-27: 500 mg

## 2015-05-27 MED ORDER — VANCOMYCIN HCL 500 MG IV SOLR
INTRAVENOUS | Status: AC
  Filled 2015-05-27: qty 500

## 2015-05-27 MED ORDER — MIDAZOLAM HCL 2 MG/2ML IJ SOLN
INTRAMUSCULAR | Status: AC
  Filled 2015-05-27: qty 4

## 2015-05-27 MED ORDER — METOCLOPRAMIDE HCL 5 MG/ML IJ SOLN
5.0000 mg | Freq: Three times a day (TID) | INTRAMUSCULAR | Status: DC | PRN
Start: 2015-05-27 — End: 2015-06-19
  Administered 2015-06-03: 10 mg via INTRAVENOUS
  Filled 2015-05-27: qty 2

## 2015-05-27 MED ORDER — HYDROMORPHONE HCL 1 MG/ML IJ SOLN
0.2500 mg | INTRAMUSCULAR | Status: DC | PRN
  Administered 2015-05-27 (×4): 0.5 mg via INTRAVENOUS

## 2015-05-27 MED ORDER — OXYCODONE HCL 5 MG PO TABS: 5.0000 mg | ORAL_TABLET | Freq: Once | ORAL | Status: DC | PRN

## 2015-05-27 MED ORDER — 0.9 % SODIUM CHLORIDE (POUR BTL) OPTIME
TOPICAL | Status: DC | PRN
  Administered 2015-05-27: 1000 mL

## 2015-05-27 MED ORDER — MIDAZOLAM HCL 5 MG/5ML IJ SOLN
INTRAMUSCULAR | Status: DC | PRN
  Administered 2015-05-27: 2 mg via INTRAVENOUS

## 2015-05-27 MED ORDER — LACTATED RINGERS IV SOLN
INTRAVENOUS | Status: DC | PRN
  Administered 2015-05-27 (×2): via INTRAVENOUS

## 2015-05-27 MED ORDER — DOCUSATE SODIUM 100 MG PO CAPS
100.0000 mg | ORAL_CAPSULE | Freq: Two times a day (BID) | ORAL | Status: DC
  Administered 2015-05-27 – 2015-06-19 (×40): 100 mg via ORAL
  Filled 2015-05-27 (×40): qty 1

## 2015-05-27 MED ORDER — METHOCARBAMOL 1000 MG/10ML IJ SOLN
500.0000 mg | Freq: Four times a day (QID) | INTRAMUSCULAR | Status: DC | PRN
  Filled 2015-05-27 (×3): qty 5

## 2015-05-27 MED ORDER — LIDOCAINE HCL (CARDIAC) 20 MG/ML IV SOLN
INTRAVENOUS | Status: DC | PRN
  Administered 2015-05-27: 60 mg via INTRAVENOUS

## 2015-05-27 MED ORDER — ACETAMINOPHEN 325 MG PO TABS
650.0000 mg | ORAL_TABLET | Freq: Four times a day (QID) | ORAL | Status: DC | PRN
  Administered 2015-05-29 – 2015-05-30 (×2): 650 mg via ORAL
  Filled 2015-05-27 (×2): qty 2

## 2015-05-27 MED ORDER — ACETAMINOPHEN 650 MG RE SUPP: 650.0000 mg | Freq: Four times a day (QID) | RECTAL | Status: DC | PRN

## 2015-05-27 SURGICAL SUPPLY — 74 items
BANDAGE ELASTIC 4 VELCRO ST LF (GAUZE/BANDAGES/DRESSINGS) IMPLANT
BNDG GAUZE ELAST 4 BULKY (GAUZE/BANDAGES/DRESSINGS) IMPLANT
BRUSH FEMORAL CANAL (MISCELLANEOUS) IMPLANT
BRUSH SCRUB DISP (MISCELLANEOUS) ×6 IMPLANT
CANISTER SUCTION 2500CC (MISCELLANEOUS) ×3 IMPLANT
CANISTER WOUND CARE 500ML ATS (WOUND CARE) ×3 IMPLANT
COVER SURGICAL LIGHT HANDLE (MISCELLANEOUS) ×6 IMPLANT
DRAPE C-ARMOR (DRAPES) ×3 IMPLANT
DRAPE IMP U-DRAPE 54X76 (DRAPES) ×3 IMPLANT
DRAPE INCISE IOBAN 66X45 STRL (DRAPES) IMPLANT
DRAPE LAPAROTOMY T 102X78X121 (DRAPES) ×2 IMPLANT
DRAPE ORTHO SPLIT 77X108 STRL (DRAPES) ×6
DRAPE PROXIMA HALF (DRAPES) IMPLANT
DRAPE SURG ORHT 6 SPLT 77X108 (DRAPES) ×2 IMPLANT
DRAPE U-SHAPE 47X51 STRL (DRAPES) ×3 IMPLANT
DRSG AQUACEL AG ADV 3.5X10 (GAUZE/BANDAGES/DRESSINGS) ×2 IMPLANT
DRSG PAD ABDOMINAL 8X10 ST (GAUZE/BANDAGES/DRESSINGS) ×3 IMPLANT
DRSG VAC ATS LRG SENSATRAC (GAUZE/BANDAGES/DRESSINGS) IMPLANT
DRSG VAC ATS MED SENSATRAC (GAUZE/BANDAGES/DRESSINGS) ×2 IMPLANT
DRSG VAC ATS SM SENSATRAC (GAUZE/BANDAGES/DRESSINGS) IMPLANT
ELECT BLADE 6.5 EXT (BLADE) ×3 IMPLANT
ELECT CAUTERY BLADE 6.4 (BLADE) ×3 IMPLANT
ELECT REM PT RETURN 9FT ADLT (ELECTROSURGICAL) ×3
ELECTRODE REM PT RTRN 9FT ADLT (ELECTROSURGICAL) ×1 IMPLANT
EVACUATOR 1/8 PVC DRAIN (DRAIN) IMPLANT
GAUZE SPONGE 4X4 12PLY STRL (GAUZE/BANDAGES/DRESSINGS) ×3 IMPLANT
GAUZE XEROFORM 5X9 LF (GAUZE/BANDAGES/DRESSINGS) ×3 IMPLANT
GLOVE BIO SURGEON STRL SZ7.5 (GLOVE) ×7 IMPLANT
GLOVE BIO SURGEON STRL SZ8 (GLOVE) ×5 IMPLANT
GLOVE BIOGEL PI IND STRL 6.5 (GLOVE) IMPLANT
GLOVE BIOGEL PI IND STRL 7.5 (GLOVE) ×1 IMPLANT
GLOVE BIOGEL PI IND STRL 8 (GLOVE) ×2 IMPLANT
GLOVE BIOGEL PI INDICATOR 6.5 (GLOVE) ×4
GLOVE BIOGEL PI INDICATOR 7.5 (GLOVE) ×4
GLOVE BIOGEL PI INDICATOR 8 (GLOVE) ×4
GLOVE SURG SS PI 6.5 STRL IVOR (GLOVE) ×4 IMPLANT
GOWN STRL REUS W/ TWL LRG LVL3 (GOWN DISPOSABLE) ×1 IMPLANT
GOWN STRL REUS W/ TWL XL LVL3 (GOWN DISPOSABLE) ×1 IMPLANT
GOWN STRL REUS W/TWL 2XL LVL3 (GOWN DISPOSABLE) ×9 IMPLANT
GOWN STRL REUS W/TWL LRG LVL3 (GOWN DISPOSABLE) ×3
GOWN STRL REUS W/TWL XL LVL3 (GOWN DISPOSABLE) ×3
HANDPIECE INTERPULSE COAX TIP (DISPOSABLE)
KIT BASIN OR (CUSTOM PROCEDURE TRAY) ×3 IMPLANT
KIT ROOM TURNOVER OR (KITS) ×3 IMPLANT
KIT STIMULAN RAPID CURE 5CC (Orthopedic Implant) ×2 IMPLANT
MANIFOLD NEPTUNE II (INSTRUMENTS) ×3 IMPLANT
NEEDLE 22X1 1/2 (OR ONLY) (NEEDLE) IMPLANT
NS IRRIG 1000ML POUR BTL (IV SOLUTION) ×3 IMPLANT
PACK ORTHO EXTREMITY (CUSTOM PROCEDURE TRAY) ×3 IMPLANT
PACK TOTAL JOINT (CUSTOM PROCEDURE TRAY) ×3 IMPLANT
PACK UNIVERSAL I (CUSTOM PROCEDURE TRAY) ×3 IMPLANT
PAD ARMBOARD 7.5X6 YLW CONV (MISCELLANEOUS) ×6 IMPLANT
PILLOW ABDUCTION HIP (SOFTGOODS) IMPLANT
SET CYSTO W/LG BORE CLAMP LF (SET/KITS/TRAYS/PACK) ×2 IMPLANT
SET HNDPC FAN SPRY TIP SCT (DISPOSABLE) IMPLANT
SPONGE LAP 18X18 X RAY DECT (DISPOSABLE) IMPLANT
STAPLER VISISTAT 35W (STAPLE) ×3 IMPLANT
SUT VIC AB 0 CT1 27 (SUTURE) ×6
SUT VIC AB 0 CT1 27XBRD ANBCTR (SUTURE) ×2 IMPLANT
SUT VIC AB 1 CT1 27 (SUTURE) ×3
SUT VIC AB 1 CT1 27XBRD ANBCTR (SUTURE) ×1 IMPLANT
SUT VIC AB 2-0 CT1 27 (SUTURE) ×3
SUT VIC AB 2-0 CT1 TAPERPNT 27 (SUTURE) ×1 IMPLANT
SWAB COLLECTION DEVICE MRSA (MISCELLANEOUS) ×2 IMPLANT
SYR CONTROL 10ML LL (SYRINGE) ×3 IMPLANT
TOWEL OR 17X24 6PK STRL BLUE (TOWEL DISPOSABLE) ×3 IMPLANT
TOWEL OR 17X26 10 PK STRL BLUE (TOWEL DISPOSABLE) ×6 IMPLANT
TOWER CARTRIDGE SMART MIX (DISPOSABLE) IMPLANT
TRAY FOLEY CATH 16FRSI W/METER (SET/KITS/TRAYS/PACK) IMPLANT
TUBE ANAEROBIC SPECIMEN COL (MISCELLANEOUS) ×2 IMPLANT
TUBE CONNECTING 12'X1/4 (SUCTIONS) ×1
TUBE CONNECTING 12X1/4 (SUCTIONS) ×2 IMPLANT
WATER STERILE IRR 1000ML POUR (IV SOLUTION) ×12 IMPLANT
YANKAUER SUCT BULB TIP NO VENT (SUCTIONS) ×3 IMPLANT

## 2015-05-27 NOTE — Transfer of Care (Signed)
Immediate Anesthesia Transfer of Care Note  Patient: Mathew Norman  Procedure(s) Performed: Procedure(s): IRRIGATION AND DEBRIDEMENT PELVIS (N/A) APPLICATION OF WOUND VAC with application of antibiotic beads (N/A)  Patient Location: PACU  Anesthesia Type:General  Level of Consciousness: awake, alert  and oriented  Airway & Oxygen Therapy: Patient Spontanous Breathing and Patient connected to nasal cannula oxygen  Post-op Assessment: Report given to RN, Post -op Vital signs reviewed and stable and Patient moving all extremities X 4  Post vital signs: Reviewed and stable  Last Vitals:  Filed Vitals:   05/27/15 1300  BP: 136/71  Pulse: 80  Temp: 36.9 C  Resp: 16    Complications: No apparent anesthesia complications

## 2015-05-27 NOTE — Anesthesia Preprocedure Evaluation (Addendum)
Anesthesia Evaluation  Patient identified by MRN, date of birth, ID band Patient awake    Reviewed: Allergy & Precautions, NPO status , Patient's Chart, lab work & pertinent test results  History of Anesthesia Complications Negative for: history of anesthetic complications  Airway Mallampati: I  TM Distance: >3 FB Neck ROM: Full    Dental  (+) Dental Advisory Given, Teeth Intact   Pulmonary neg pulmonary ROS,    breath sounds clear to auscultation       Cardiovascular negative cardio ROS   Rhythm:Regular     Neuro/Psych negative neurological ROS  negative psych ROS   GI/Hepatic negative GI ROS, Neg liver ROS,   Endo/Other  Morbid obesity  Renal/GU negative Renal ROS     Musculoskeletal   Abdominal   Peds  Hematology  (+) anemia ,   Anesthesia Other Findings   Reproductive/Obstetrics                            Anesthesia Physical Anesthesia Plan  ASA: II  Anesthesia Plan: General   Post-op Pain Management:    Induction: Intravenous  Airway Management Planned: Oral ETT  Additional Equipment: None  Intra-op Plan:   Post-operative Plan: Extubation in OR  Informed Consent: I have reviewed the patients History and Physical, chart, labs and discussed the procedure including the risks, benefits and alternatives for the proposed anesthesia with the patient or authorized representative who has indicated his/her understanding and acceptance.   Dental advisory given  Plan Discussed with: CRNA, Anesthesiologist and Surgeon  Anesthesia Plan Comments:        Anesthesia Quick Evaluation

## 2015-05-27 NOTE — Progress Notes (Signed)
Requested trapeze and overhead frame from ortho tech.  Per [employer] tech on duty, there are none available in hospital at this time. He will put patient on list to get one when they are available.

## 2015-05-27 NOTE — Anesthesia Procedure Notes (Signed)
Procedure Name: Intubation Date/Time: 05/27/2015 5:09 PM Performed by: Rise Patience T Pre-anesthesia Checklist: Patient identified, Emergency Drugs available, Suction available and Patient being monitored Patient Re-evaluated:Patient Re-evaluated prior to inductionOxygen Delivery Method: Circle system utilized Preoxygenation: Pre-oxygenation with 100% oxygen Intubation Type: IV induction Ventilation: Mask ventilation without difficulty Laryngoscope Size: Miller and 2 Grade View: Grade I Tube type: Oral Number of attempts: 1 Airway Equipment and Method: Stylet Placement Confirmation: ETT inserted through vocal cords under direct vision,  positive ETCO2 and breath sounds checked- equal and bilateral Secured at: 22 cm Tube secured with: Tape Dental Injury: Teeth and Oropharynx as per pre-operative assessment

## 2015-05-28 ENCOUNTER — Encounter (HOSPITAL_COMMUNITY): Payer: Self-pay | Admitting: Orthopedic Surgery

## 2015-05-28 LAB — CBC WITH DIFFERENTIAL/PLATELET
BASOS ABS: 0 10*3/uL (ref 0.0–0.1)
Basophils Relative: 0 %
Eosinophils Absolute: 0.2 10*3/uL (ref 0.0–0.7)
Eosinophils Relative: 3 %
HEMATOCRIT: 30.1 % — AB (ref 39.0–52.0)
HEMOGLOBIN: 9.8 g/dL — AB (ref 13.0–17.0)
LYMPHS PCT: 27 %
Lymphs Abs: 1.5 10*3/uL (ref 0.7–4.0)
MCH: 27.5 pg (ref 26.0–34.0)
MCHC: 32.6 g/dL (ref 30.0–36.0)
MCV: 84.3 fL (ref 78.0–100.0)
MONO ABS: 0.5 10*3/uL (ref 0.1–1.0)
Monocytes Relative: 9 %
NEUTROS ABS: 3.4 10*3/uL (ref 1.7–7.7)
NEUTROS PCT: 61 %
Platelets: 226 10*3/uL (ref 150–400)
RBC: 3.57 MIL/uL — AB (ref 4.22–5.81)
RDW: 12.4 % (ref 11.5–15.5)
WBC: 5.5 10*3/uL (ref 4.0–10.5)

## 2015-05-28 LAB — GLUCOSE, CAPILLARY
GLUCOSE-CAPILLARY: 106 mg/dL — AB (ref 65–99)
Glucose-Capillary: 121 mg/dL — ABNORMAL HIGH (ref 65–99)
Glucose-Capillary: 134 mg/dL — ABNORMAL HIGH (ref 65–99)
Glucose-Capillary: 97 mg/dL (ref 65–99)

## 2015-05-28 LAB — GRAM STAIN

## 2015-05-28 LAB — PROTIME-INR
INR: 2.17 — AB (ref 0.00–1.49)
Prothrombin Time: 24 seconds — ABNORMAL HIGH (ref 11.6–15.2)

## 2015-05-28 MED ORDER — PIPERACILLIN-TAZOBACTAM 3.375 G IVPB
3.3750 g | Freq: Three times a day (TID) | INTRAVENOUS | Status: DC
  Administered 2015-05-28 – 2015-05-29 (×5): 3.375 g via INTRAVENOUS
  Filled 2015-05-28 (×6): qty 50

## 2015-05-28 MED ORDER — RIFAMPIN 300 MG PO CAPS
600.0000 mg | ORAL_CAPSULE | Freq: Every day | ORAL | Status: DC
  Administered 2015-05-28: 600 mg via ORAL
  Filled 2015-05-28 (×2): qty 2

## 2015-05-28 MED ORDER — VANCOMYCIN HCL IN DEXTROSE 1-5 GM/200ML-% IV SOLN
1000.0000 mg | Freq: Three times a day (TID) | INTRAVENOUS | Status: DC
  Administered 2015-05-28 – 2015-05-30 (×5): 1000 mg via INTRAVENOUS
  Filled 2015-05-28 (×8): qty 200

## 2015-05-28 MED ORDER — ENOXAPARIN SODIUM 60 MG/0.6ML ~~LOC~~ SOLN
60.0000 mg | SUBCUTANEOUS | Status: DC
  Administered 2015-05-28 – 2015-06-01 (×4): 60 mg via SUBCUTANEOUS
  Filled 2015-05-28 (×4): qty 0.6

## 2015-05-28 MED ORDER — SODIUM CHLORIDE 0.9 % IV SOLN
2000.0000 mg | Freq: Once | INTRAVENOUS | Status: AC
  Administered 2015-05-28: 2000 mg via INTRAVENOUS
  Filled 2015-05-28 (×2): qty 2000

## 2015-05-28 MED ORDER — DIPHENHYDRAMINE HCL 25 MG PO CAPS
25.0000 mg | ORAL_CAPSULE | Freq: Four times a day (QID) | ORAL | Status: DC | PRN
  Administered 2015-05-28 – 2015-06-01 (×2): 25 mg via ORAL
  Filled 2015-05-28 (×2): qty 1
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
## 2015-05-28 NOTE — Progress Notes (Signed)
PT Cancellation Note  Patient Details Name: Mathew Norman MRN: [id number] DOB: 04-22-1996   Cancelled Treatment:    Reason Eval/Treat Not Completed: Patient declined, no reason specified Patient reports he has just transferred back into bed. Requests PT evaluation be held at this time. Will follow-up as time/schedule allows today.  Berton Mount 05/28/2015, 2:16 PM Sunday Spillers [place], [place] [phone]

## 2015-05-28 NOTE — Progress Notes (Signed)
Orthopaedic Trauma Service Progress Note  Subjective  Doing well Feels much better than preop  Discussed prediabetes this am and the progression to DM Pt has fam Hx of DM (grandfather) Pt has quite a sweet tooth, ordered Cake for lunch   ROS As above   Objective   BP 130/56 mmHg  Pulse 96  Temp(Src) 98.8 F (37.1 C) (Oral)  Resp 19  Ht 5\' 6"  (1.676 m)  Wt 117.028 kg (258 lb)  BMI 41.66 kg/m2  SpO2 100%  Body mass index is 41.66 kg/(m^2).   Intake/Output      09/13 0701 - 09/14 0700 09/14 0701 - 09/15 0700   P.O. 0    I.V. (mL/kg) 2036.7 (17.4)    Total Intake(mL/kg) 2036.7 (17.4)    Urine (mL/kg/hr) 1400 (0.5)    Drains 125 (0)    Stool     Blood 20 (0)    Total Output 1545     Net +491.7            Labs Results for AYUUB, PENLEY (MRN [id number]) as of 05/28/2015 09:58  Ref. Range 05/28/2015 03:20  WBC Latest Ref Range: 4.0-10.5 K/uL 5.5  RBC Latest Ref Range: 4.22-5.81 MIL/uL 3.57 (L)  Hemoglobin Latest Ref Range: 13.0-17.0 g/dL 9.8 (L)  HCT Latest Ref Range: 39.0-52.0 % 30.1 (L)  MCV Latest Ref Range: 78.0-100.0 fL 84.3  MCH Latest Ref Range: 26.0-34.0 pg 27.5  MCHC Latest Ref Range: 30.0-36.0 g/dL 35.4  RDW Latest Ref Range: 11.5-15.5 % 12.4  Platelets Latest Ref Range: 150-400 K/uL 226  Neutrophils Latest Units: % 61  Lymphocytes Latest Units: % 27  Monocytes Relative Latest Units: % 9  Eosinophil Latest Units: % 3  Basophil Latest Units: % 0  NEUT# Latest Ref Range: 1.7-7.7 K/uL 3.4  Lymphocyte # Latest Ref Range: 0.7-4.0 K/uL 1.5  Monocyte # Latest Ref Range: 0.1-1.0 K/uL 0.5  Eosinophils Absolute Latest Ref Range: 0.0-0.7 K/uL 0.2  Basophils Absolute Latest Ref Range: 0.0-0.1 K/uL 0.0  Prothrombin Time Latest Ref Range: 11.6-15.2 seconds 24.0 (H)  INR Latest Ref Range: 0.00-1.49  2.17 (H)   Anaerobic culture  Status: Preliminary result     Visible to patient:  Not Released     Next appt: None              1d ago     Specimen  Description WOUND PELVIS    Special Requests NONE    Gram Stain MODERATE WBC PRESENT, PREDOMINANTLY PMN  NO SQUAMOUS EPITHELIAL CELLS SEEN  MODERATE GRAM POSITIVE COCCI  IN PAIRS  Performed at [facility]        Culture NO ANAEROBES ISOLATED; CULTURE IN PROGRESS FOR 5 DAYS  Performed at [facility]        Report Status PENDING    Resulting Agency [facility]        Specimen Collected: 05/27/15 5:39 PM Last Resulted: 05/28/15 9:49 AM                    Results for FINES, KIMBERLIN (MRN [id number]) as of 05/28/2015 09:58  Ref. Range 05/26/2015 18:45  CRP Latest Ref Range: <1.0 mg/dL 70.0 (H)   Results for MOHID, FURUYA (MRN [id number]) as of 05/28/2015 09:58  Ref. Range 05/26/2015 18:45  Sed Rate Latest Ref Range: 0-16 mm/hr 75 (H)  Results for KESLER, WICKHAM (MRN [id number]) as of 05/28/2015 09:58  Ref. Range 05/26/2015 18:45  Hemoglobin A1C Latest Ref Range: 4.8-5.6 %  5.8 (H)   Exam  Gen: resting comfortably in bed, NAD Lungs: clear, unlabored Cardiac: RRR Abd: + BS, obese, NT Pelvis:       Vac functioning       Good seal      Dressing L hip stable   Assessment and Plan   POD/HD#: 1  19 y/o male with pelvic abscess and osteomyelitis 4 weeks s/p ORIF pelvic ring and L acetabulum   1. 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 Pain management:  Continue with current regimen  3. ABL anemia/Hemodynamics  Stable  4. Medical issues   Pre-diabetes   Had discussion about food choices   Will have RD see pt     No sure how cake is permitted on carb mod diet?  5. DVT/PE prophylaxis:  Lovenox while inpt  6. ID:   See #1  Vanc, zosyn and rifampin    7. FEN/Foley/Lines:  CHO mod diet     8.  Dispo:  OR tomorrow for repeat I&D  Need to arrange for [facility] and home abx    Mearl Latin, PA-C [facility] [phone] (O) 05/28/2015 9:57 AM
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
## 2015-05-28 NOTE — Progress Notes (Signed)
Notified by microbiology lab that patient's wound culture positive for few gram +cocci in pairs.

## 2015-05-29 ENCOUNTER — Inpatient Hospital Stay (HOSPITAL_COMMUNITY): Payer: Commercial Managed Care - HMO | Admitting: Anesthesiology

## 2015-05-29 ENCOUNTER — Encounter (HOSPITAL_COMMUNITY)
Admission: EM | Disposition: A | Discharge status: Home / Self Care | Payer: Self-pay | Source: Home / Self Care | Attending: Orthopedic Surgery

## 2015-05-29 ENCOUNTER — Encounter (HOSPITAL_COMMUNITY): Payer: Self-pay | Admitting: Anesthesiology

## 2015-05-29 DIAGNOSIS — S32402D Unspecified fracture of left acetabulum, subsequent encounter for fracture with routine healing: Secondary | ICD-10-CM | POA: Diagnosis not present

## 2015-05-29 DIAGNOSIS — S41111D Laceration without foreign body of right upper arm, subsequent encounter: Secondary | ICD-10-CM

## 2015-05-29 DIAGNOSIS — S32810D Multiple fractures of pelvis with stable disruption of pelvic ring, subsequent encounter for fracture with routine healing: Secondary | ICD-10-CM | POA: Diagnosis not present

## 2015-05-29 DIAGNOSIS — Z7901 Long term (current) use of anticoagulants: Secondary | ICD-10-CM

## 2015-05-29 DIAGNOSIS — B9689 Other specified bacterial agents as the cause of diseases classified elsewhere: Secondary | ICD-10-CM

## 2015-05-29 DIAGNOSIS — S71112D Laceration without foreign body, left thigh, subsequent encounter: Secondary | ICD-10-CM

## 2015-05-29 DIAGNOSIS — M8618 Other acute osteomyelitis, other site: Secondary | ICD-10-CM

## 2015-05-29 HISTORY — PX: INCISION AND DRAINAGE HIP: SHX1801

## 2015-05-29 LAB — PROTIME-INR
INR: 1.57 — ABNORMAL HIGH (ref 0.00–1.49)
Prothrombin Time: 18.8 seconds — ABNORMAL HIGH (ref 11.6–15.2)

## 2015-05-29 LAB — CBC
HCT: 31.2 % — ABNORMAL LOW (ref 39.0–52.0)
Hemoglobin: 10.2 g/dL — ABNORMAL LOW (ref 13.0–17.0)
MCH: 27.3 pg (ref 26.0–34.0)
MCHC: 32.7 g/dL (ref 30.0–36.0)
MCV: 83.6 fL (ref 78.0–100.0)
Platelets: 267 K/uL (ref 150–400)
RBC: 3.73 MIL/uL — ABNORMAL LOW (ref 4.22–5.81)
RDW: 12.6 % (ref 11.5–15.5)
WBC: 5.8 K/uL (ref 4.0–10.5)

## 2015-05-29 LAB — GLUCOSE, CAPILLARY
GLUCOSE-CAPILLARY: 116 mg/dL — AB (ref 65–99)
GLUCOSE-CAPILLARY: 103 mg/dL — AB (ref 65–99)
Glucose-Capillary: 96 mg/dL (ref 65–99)
Glucose-Capillary: 123 mg/dL — ABNORMAL HIGH (ref 65–99)

## 2015-05-29 LAB — VANCOMYCIN, TROUGH: Vancomycin Tr: 18 ug/mL (ref 10.0–20.0)

## 2015-05-29 LAB — COMPREHENSIVE METABOLIC PANEL WITH GFR
ALT: 18 U/L (ref 17–63)
AST: 20 U/L (ref 15–41)
Albumin: 2.7 g/dL — ABNORMAL LOW (ref 3.5–5.0)
Alkaline Phosphatase: 54 U/L (ref 38–126)
Anion gap: 7 (ref 5–15)
BUN: 7 mg/dL (ref 6–20)
CO2: 30 mmol/L (ref 22–32)
Calcium: 9.1 mg/dL (ref 8.9–10.3)
Chloride: 103 mmol/L (ref 101–111)
Creatinine, Ser: 0.87 mg/dL (ref 0.61–1.24)
GFR calc Af Amer: >60 mL/min (ref >60)
GFR calc non Af Amer: >60 mL/min (ref >60)
Glucose, Bld: 114 mg/dL — ABNORMAL HIGH (ref 65–99)
Potassium: 3.6 mmol/L (ref 3.5–5.1)
Sodium: 140 mmol/L (ref 135–145)
Total Bilirubin: 0.5 mg/dL (ref 0.3–1.2)
Total Protein: 6.5 g/dL (ref 6.5–8.1)

## 2015-05-29 LAB — SURGICAL PCR SCREEN
MRSA, PCR: NEGATIVE
Staphylococcus aureus: NEGATIVE

## 2015-05-29 LAB — VITAMIN D 25 HYDROXY (VIT D DEFICIENCY, FRACTURES): Vit D, 25-Hydroxy: 16.8 ng/mL — ABNORMAL LOW (ref 30.0–100.0)

## 2015-05-29 SURGERY — IRRIGATION AND DEBRIDEMENT HIP
Anesthesia: General | Site: Pelvis

## 2015-05-29 MED ORDER — GLYCOPYRROLATE 0.2 MG/ML IJ SOLN
INTRAMUSCULAR | Status: DC | PRN
  Administered 2015-05-29: 0.6 mg via INTRAVENOUS

## 2015-05-29 MED ORDER — VANCOMYCIN HCL 500 MG IV SOLR
INTRAVENOUS | Status: AC
  Filled 2015-05-29: qty 500

## 2015-05-29 MED ORDER — ONDANSETRON HCL 4 MG/2ML IJ SOLN
INTRAMUSCULAR | Status: AC
  Filled 2015-05-29: qty 2

## 2015-05-29 MED ORDER — MIDAZOLAM HCL 2 MG/2ML IJ SOLN
INTRAMUSCULAR | Status: AC
  Filled 2015-05-29: qty 4

## 2015-05-29 MED ORDER — TOBRAMYCIN SULFATE 80 MG/2ML IJ SOLN
INTRAMUSCULAR | Status: AC
  Filled 2015-05-29: qty 2

## 2015-05-29 MED ORDER — HYDROMORPHONE HCL 1 MG/ML IJ SOLN
INTRAMUSCULAR | Status: AC
  Filled 2015-05-29: qty 1

## 2015-05-29 MED ORDER — PROPOFOL 10 MG/ML IV BOLUS
INTRAVENOUS | Status: AC
  Filled 2015-05-29: qty 20

## 2015-05-29 MED ORDER — HYDROMORPHONE HCL 1 MG/ML IJ SOLN: 0.2500 mg | INTRAMUSCULAR | Status: DC | PRN

## 2015-05-29 MED ORDER — FENTANYL CITRATE (PF) 250 MCG/5ML IJ SOLN
INTRAMUSCULAR | Status: AC
Start: 2015-05-29 — End: 2015-05-29
  Filled 2015-05-29: qty 5

## 2015-05-29 MED ORDER — ARTIFICIAL TEARS OP OINT
TOPICAL_OINTMENT | OPHTHALMIC | Status: AC
  Filled 2015-05-29: qty 3.5

## 2015-05-29 MED ORDER — SODIUM CHLORIDE 0.9 % IR SOLN
Status: DC | PRN
  Administered 2015-05-29: 6000 mL

## 2015-05-29 MED ORDER — SUCCINYLCHOLINE CHLORIDE 20 MG/ML IJ SOLN
INTRAMUSCULAR | Status: AC
  Filled 2015-05-29: qty 1

## 2015-05-29 MED ORDER — SODIUM CHLORIDE 0.9 % IJ SOLN
INTRAMUSCULAR | Status: AC
  Filled 2015-05-29: qty 10

## 2015-05-29 MED ORDER — VANCOMYCIN HCL 500 MG IV SOLR
INTRAVENOUS | Status: DC | PRN
  Administered 2015-05-29: 500 mg

## 2015-05-29 MED ORDER — TOBRAMYCIN SULFATE 1.2 G IJ SOLR
INTRAMUSCULAR | Status: DC | PRN
  Administered 2015-05-29: 1.2 g

## 2015-05-29 MED ORDER — ARTIFICIAL TEARS OP OINT
TOPICAL_OINTMENT | OPHTHALMIC | Status: DC | PRN
  Administered 2015-05-29: 1 via OPHTHALMIC

## 2015-05-29 MED ORDER — GLYCOPYRROLATE 0.2 MG/ML IJ SOLN
INTRAMUSCULAR | Status: AC
  Filled 2015-05-29: qty 4

## 2015-05-29 MED ORDER — HYDROMORPHONE HCL 1 MG/ML IJ SOLN
0.5000 mg | INTRAMUSCULAR | Status: AC | PRN
  Administered 2015-05-29 (×4): 0.5 mg via INTRAVENOUS

## 2015-05-29 MED ORDER — EPHEDRINE SULFATE 50 MG/ML IJ SOLN
INTRAMUSCULAR | Status: AC
  Filled 2015-05-29: qty 1

## 2015-05-29 MED ORDER — RIFAMPIN 300 MG PO CAPS
300.0000 mg | ORAL_CAPSULE | Freq: Every day | ORAL | Status: DC
  Administered 2015-05-30 – 2015-06-10 (×12): 300 mg via ORAL
  Filled 2015-05-29 (×13): qty 1

## 2015-05-29 MED ORDER — PROPOFOL 10 MG/ML IV BOLUS
INTRAVENOUS | Status: DC | PRN
  Administered 2015-05-29: 200 mg via INTRAVENOUS

## 2015-05-29 MED ORDER — ESMOLOL HCL 10 MG/ML IV SOLN
INTRAVENOUS | Status: DC | PRN
  Administered 2015-05-29 (×2): 10 mg via INTRAVENOUS
  Administered 2015-05-29: 20 mg via INTRAVENOUS
  Administered 2015-05-29: 10 mg via INTRAVENOUS

## 2015-05-29 MED ORDER — ROCURONIUM BROMIDE 50 MG/5ML IV SOLN
INTRAVENOUS | Status: AC
  Filled 2015-05-29: qty 1

## 2015-05-29 MED ORDER — MIDAZOLAM HCL 5 MG/5ML IJ SOLN
INTRAMUSCULAR | Status: DC | PRN
  Administered 2015-05-29: 2 mg via INTRAVENOUS

## 2015-05-29 MED ORDER — FENTANYL CITRATE (PF) 100 MCG/2ML IJ SOLN
INTRAMUSCULAR | Status: DC | PRN
  Administered 2015-05-29: 150 ug via INTRAVENOUS
  Administered 2015-05-29: 100 ug via INTRAVENOUS

## 2015-05-29 MED ORDER — ESMOLOL HCL 10 MG/ML IV SOLN
INTRAVENOUS | Status: AC
  Filled 2015-05-29: qty 10

## 2015-05-29 MED ORDER — NEOSTIGMINE METHYLSULFATE 10 MG/10ML IV SOLN
INTRAVENOUS | Status: DC | PRN
  Administered 2015-05-29: 4 mg via INTRAVENOUS

## 2015-05-29 MED ORDER — PROMETHAZINE HCL 25 MG/ML IJ SOLN: 6.2500 mg | INTRAMUSCULAR | Status: DC | PRN

## 2015-05-29 MED ORDER — ROCURONIUM BROMIDE 100 MG/10ML IV SOLN
INTRAVENOUS | Status: DC | PRN
  Administered 2015-05-29: 50 mg via INTRAVENOUS

## 2015-05-29 MED ORDER — TOBRAMYCIN SULFATE 1.2 G IJ SOLR
INTRAMUSCULAR | Status: AC
  Filled 2015-05-29: qty 1.2

## 2015-05-29 MED ORDER — NEOSTIGMINE METHYLSULFATE 10 MG/10ML IV SOLN
INTRAVENOUS | Status: AC
  Filled 2015-05-29: qty 1

## 2015-05-29 SURGICAL SUPPLY — 57 items
BRUSH FEMORAL CANAL (MISCELLANEOUS) IMPLANT
BRUSH SCRUB DISP (MISCELLANEOUS) ×6 IMPLANT
CANISTER WOUND CARE 500ML ATS (WOUND CARE) ×2 IMPLANT
COVER SURGICAL LIGHT HANDLE (MISCELLANEOUS) ×6 IMPLANT
DRAPE C-ARMOR (DRAPES) ×3 IMPLANT
DRAPE IMP U-DRAPE 54X76 (DRAPES) ×3 IMPLANT
DRAPE INCISE IOBAN 66X45 STRL (DRAPES) IMPLANT
DRAPE ORTHO SPLIT 77X108 STRL (DRAPES) ×6
DRAPE SURG ORHT 6 SPLT 77X108 (DRAPES) ×2 IMPLANT
DRAPE U-SHAPE 47X51 STRL (DRAPES) ×3 IMPLANT
DRSG AQUACEL AG ADV 3.5X10 (GAUZE/BANDAGES/DRESSINGS) ×2 IMPLANT
DRSG MEPITEL 4X7.2 (GAUZE/BANDAGES/DRESSINGS) ×2 IMPLANT
DRSG PAD ABDOMINAL 8X10 ST (GAUZE/BANDAGES/DRESSINGS) ×3 IMPLANT
DRSG VAC ATS SM SENSATRAC (GAUZE/BANDAGES/DRESSINGS) ×2 IMPLANT
ELECT BLADE 6.5 EXT (BLADE) ×3 IMPLANT
ELECT CAUTERY BLADE 6.4 (BLADE) ×3 IMPLANT
ELECT REM PT RETURN 9FT ADLT (ELECTROSURGICAL) ×3
ELECTRODE REM PT RTRN 9FT ADLT (ELECTROSURGICAL) ×1 IMPLANT
EVACUATOR 1/8 PVC DRAIN (DRAIN) IMPLANT
GAUZE SPONGE 4X4 12PLY STRL (GAUZE/BANDAGES/DRESSINGS) ×3 IMPLANT
GAUZE XEROFORM 5X9 LF (GAUZE/BANDAGES/DRESSINGS) ×3 IMPLANT
GLOVE BIO SURGEON STRL SZ7.5 (GLOVE) ×3 IMPLANT
GLOVE BIO SURGEON STRL SZ8 (GLOVE) ×3 IMPLANT
GLOVE BIOGEL PI IND STRL 7.5 (GLOVE) ×1 IMPLANT
GLOVE BIOGEL PI IND STRL 8 (GLOVE) ×1 IMPLANT
GLOVE BIOGEL PI INDICATOR 7.5 (GLOVE) ×2
GLOVE BIOGEL PI INDICATOR 8 (GLOVE) ×2
GOWN STRL REUS W/ TWL XL LVL3 (GOWN DISPOSABLE) ×1 IMPLANT
GOWN STRL REUS W/TWL 2XL LVL3 (GOWN DISPOSABLE) ×9 IMPLANT
GOWN STRL REUS W/TWL XL LVL3 (GOWN DISPOSABLE) ×3
HANDPIECE INTERPULSE COAX TIP (DISPOSABLE)
KIT ROOM TURNOVER OR (KITS) ×3 IMPLANT
KIT STIMULAN RAPID CURE  10CC (Orthopedic Implant) ×2 IMPLANT
KIT STIMULAN RAPID CURE 10CC (Orthopedic Implant) IMPLANT
MANIFOLD NEPTUNE II (INSTRUMENTS) ×3 IMPLANT
NEEDLE 22X1 1/2 (OR ONLY) (NEEDLE) IMPLANT
NS IRRIG 1000ML POUR BTL (IV SOLUTION) ×3 IMPLANT
PACK TOTAL JOINT (CUSTOM PROCEDURE TRAY) ×3 IMPLANT
PACK UNIVERSAL I (CUSTOM PROCEDURE TRAY) ×3 IMPLANT
PAD ARMBOARD 7.5X6 YLW CONV (MISCELLANEOUS) ×6 IMPLANT
PILLOW ABDUCTION HIP (SOFTGOODS) IMPLANT
SET HNDPC FAN SPRY TIP SCT (DISPOSABLE) IMPLANT
SET IRRIG Y TYPE TUR BLADDER L (SET/KITS/TRAYS/PACK) ×2 IMPLANT
SPONGE LAP 18X18 X RAY DECT (DISPOSABLE) IMPLANT
STAPLER VISISTAT 35W (STAPLE) ×3 IMPLANT
SUT VIC AB 0 CT1 27 (SUTURE) ×6
SUT VIC AB 0 CT1 27XBRD ANBCTR (SUTURE) ×2 IMPLANT
SUT VIC AB 1 CT1 27 (SUTURE) ×3
SUT VIC AB 1 CT1 27XBRD ANBCTR (SUTURE) ×1 IMPLANT
SUT VIC AB 2-0 CT1 27 (SUTURE) ×3
SUT VIC AB 2-0 CT1 TAPERPNT 27 (SUTURE) ×1 IMPLANT
SYR CONTROL 10ML LL (SYRINGE) ×3 IMPLANT
TOWEL OR 17X24 6PK STRL BLUE (TOWEL DISPOSABLE) ×3 IMPLANT
TOWEL OR 17X26 10 PK STRL BLUE (TOWEL DISPOSABLE) ×6 IMPLANT
TOWER CARTRIDGE SMART MIX (DISPOSABLE) IMPLANT
TRAY FOLEY CATH 16FRSI W/METER (SET/KITS/TRAYS/PACK) IMPLANT
WATER STERILE IRR 1000ML POUR (IV SOLUTION) ×12 IMPLANT

## 2015-05-29 NOTE — Progress Notes (Signed)
Sign out request attempt to Dr Randa Evens, not answering phone

## 2015-05-29 NOTE — Progress Notes (Signed)
PT Cancellation Note    05/29/15 0800  PT Visit Information  Last PT Received On 05/29/15  Reason Eval/Treat Not Completed Patient at procedure or test/unavailable    Taken to OR for procedure.   [address] [place], [place] [phone]

## 2015-05-29 NOTE — Anesthesia Preprocedure Evaluation (Signed)
Anesthesia Evaluation  Patient identified by MRN, date of birth, ID band Patient awake    Reviewed: Allergy & Precautions, NPO status , Patient's Chart, lab work & pertinent test results  History of Anesthesia Complications Negative for: history of anesthetic complications  Airway Mallampati: I  TM Distance: >3 FB Neck ROM: Full    Dental  (+) Dental Advisory Given, Teeth Intact   Pulmonary neg pulmonary ROS,    breath sounds clear to auscultation       Cardiovascular negative cardio ROS   Rhythm:Regular     Neuro/Psych negative neurological ROS  negative psych ROS   GI/Hepatic negative GI ROS, Neg liver ROS,   Endo/Other  Morbid obesity  Renal/GU negative Renal ROS     Musculoskeletal   Abdominal   Peds  Hematology  (+) anemia ,   Anesthesia Other Findings   Reproductive/Obstetrics                            Anesthesia Physical Anesthesia Plan  ASA: II  Anesthesia Plan: General   Post-op Pain Management:    Induction: Intravenous  Airway Management Planned: Oral ETT  Additional Equipment: None  Intra-op Plan:   Post-operative Plan: Extubation in OR  Informed Consent: I have reviewed the patients History and Physical, chart, labs and discussed the procedure including the risks, benefits and alternatives for the proposed anesthesia with the patient or authorized representative who has indicated his/her understanding and acceptance.   Dental advisory given  Plan Discussed with: CRNA, Anesthesiologist and Surgeon  Anesthesia Plan Comments:        Anesthesia Quick Evaluation  

## 2015-05-29 NOTE — Transfer of Care (Signed)
Immediate Anesthesia Transfer of Care Note  Patient: Mathew Norman  Procedure(s) Performed: Procedure(s): IRRIGATION AND DEBRIDEMENT POSSIBLE LAYERED CLOSURE OF PELVIS  (N/A)  Patient Location: PACU  Anesthesia Type:General  Level of Consciousness: awake, alert , oriented and sedated  Airway & Oxygen Therapy: Patient Spontanous Breathing and Patient connected to nasal cannula oxygen  Post-op Assessment: Report given to RN, Post -op Vital signs reviewed and stable and Patient moving all extremities  Post vital signs: Reviewed and stable  Last Vitals:  Filed Vitals:   05/29/15 0500  BP: 131/58  Pulse: 87  Temp: 37.1 C  Resp: 17    Complications: No apparent anesthesia complications

## 2015-05-29 NOTE — Progress Notes (Addendum)
Brief Nutrition Note  RD received consult for diet education. Per chart review, pt with pre-diabetes with family hx of diabetes.   Pt admitted with pelvic osteomyelitis; down in OR for I&D with possible closure. Per PA notes, pt will not need home wound VAC.   Pt currently NPO for procedure, previously on a carb modified diet. Patients who receive carb modified diets receive 3 meals per day; diet provides 1600-2000 calories per day (60-75g CHO per meal). Carbohydrates are equally distributed at each meal in attempt to optimize glycemic control, in collaboration with medications and glycemic monitoring therapies.   Wt hx reviewed; wt has been stable over the past week. CBGS rhave ranged 97-134 throughout admission. Noted most recent Hgb A1c reading 5.8 (taken 05/26/15). Previous Hgb A1c 6.1 on 04/28/15, suggesting improvement in glycemic control between hospitalization.  Attempted to see pt x 3 within the past 24 hours, however, pt was either working with other discliplines or down in OR at time of visit. Will follow-up with pt on Friday, 05/30/15, when pt is more alert and receptive to education.   Aasia Peavler A. Mayford Knife, RD, LDN, CDE Pager: [phone] After hours Pager: [phone]

## 2015-05-29 NOTE — Anesthesia Postprocedure Evaluation (Signed)
  Anesthesia Post-op Note  Patient: Mathew Norman  Procedure(s) Performed: Procedure(s): IRRIGATION AND DEBRIDEMENT POSSIBLE LAYERED CLOSURE OF PELVIS  (N/A)  Patient Location: PACU  Anesthesia Type:General  Level of Consciousness: awake  Airway and Oxygen Therapy: Patient Spontanous Breathing  Post-op Pain: mild  Post-op Assessment: Post-op Vital signs reviewed              Post-op Vital Signs: Reviewed  Last Vitals:  Filed Vitals:   05/29/15 1053  BP: 151/72  Pulse: 90  Temp: 36.9 C  Resp: 16    Complications: No apparent anesthesia complications

## 2015-05-29 NOTE — Brief Op Note (Signed)
05/26/2015 - 05/29/2015  9:32 AM  PATIENT:  Mathew Norman  19 y.o. male  PRE-OPERATIVE DIAGNOSIS:  PELVIC OSTEOMYLITIS   POST-OPERATIVE DIAGNOSIS:  PELVIC OSTEOMYLITIS   PROCEDURES:  Procedure(s): 1. EXCISIONAL DEBRIDEMENT MUSCLE AND SUBCUTANEOUS TISSUE 2. REMOVAL AND REPLACEMENT OF ANTIBIOTIC BEADS 3. SMALL WOUND VAC  SURGEON:  Surgeon(s) and Role:    * Myrene Galas, MD - Primary  PHYSICIAN ASSISTANT: [name], PA-C  ANESTHESIA:   general  I/O:     SPECIMEN:  No Specimen  TOURNIQUET:  * No tourniquets in log *  DICTATION: .Other Dictation: Dictation Number [id number]
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
## 2015-05-29 NOTE — Anesthesia Procedure Notes (Signed)
Procedure Name: Intubation Date/Time: 05/29/2015 8:14 AM Performed by: Fransisca Kaufmann Pre-anesthesia Checklist: Patient identified, Emergency Drugs available, Suction available, Patient being monitored and Timeout performed Patient Re-evaluated:Patient Re-evaluated prior to inductionOxygen Delivery Method: Circle system utilized Preoxygenation: Pre-oxygenation with 100% oxygen Intubation Type: IV induction Ventilation: Mask ventilation without difficulty Laryngoscope Size: Miller and 2 Grade View: Grade I Tube type: Oral Tube size: 8.0 mm Number of attempts: 1 Airway Equipment and Method: Stylet Placement Confirmation: ETT inserted through vocal cords under direct vision,  positive ETCO2 and breath sounds checked- equal and bilateral Secured at: 23 cm Tube secured with: Tape Dental Injury: Teeth and Oropharynx as per pre-operative assessment

## 2015-05-30 ENCOUNTER — Inpatient Hospital Stay (HOSPITAL_COMMUNITY): Payer: Commercial Managed Care - HMO

## 2015-05-30 ENCOUNTER — Encounter (HOSPITAL_COMMUNITY): Payer: Self-pay | Admitting: Orthopedic Surgery

## 2015-05-30 DIAGNOSIS — E559 Vitamin D deficiency, unspecified: Secondary | ICD-10-CM

## 2015-05-30 DIAGNOSIS — E611 Iron deficiency: Secondary | ICD-10-CM | POA: Diagnosis present

## 2015-05-30 DIAGNOSIS — R509 Fever, unspecified: Secondary | ICD-10-CM

## 2015-05-30 DIAGNOSIS — N179 Acute kidney failure, unspecified: Secondary | ICD-10-CM

## 2015-05-30 DIAGNOSIS — N289 Disorder of kidney and ureter, unspecified: Secondary | ICD-10-CM

## 2015-05-30 HISTORY — DX: Iron deficiency: E61.1

## 2015-05-30 HISTORY — DX: Vitamin D deficiency, unspecified: E55.9

## 2015-05-30 LAB — COMPREHENSIVE METABOLIC PANEL
ALT: 21 U/L (ref 17–63)
ANION GAP: 11 (ref 5–15)
AST: 36 U/L (ref 15–41)
Albumin: 2.6 g/dL — ABNORMAL LOW (ref 3.5–5.0)
Alkaline Phosphatase: 55 U/L (ref 38–126)
BUN: 12 mg/dL (ref 6–20)
CHLORIDE: 100 mmol/L — AB (ref 101–111)
CO2: 24 mmol/L (ref 22–32)
Calcium: 9 mg/dL (ref 8.9–10.3)
Creatinine, Ser: 2.76 mg/dL — ABNORMAL HIGH (ref 0.61–1.24)
GFR calc non Af Amer: 32 mL/min — ABNORMAL LOW (ref >60)
GFR, EST AFRICAN AMERICAN: 37 mL/min — AB (ref >60)
Glucose, Bld: 105 mg/dL — ABNORMAL HIGH (ref 65–99)
Potassium: 3.9 mmol/L (ref 3.5–5.1)
SODIUM: 135 mmol/L (ref 135–145)
Total Bilirubin: 0.6 mg/dL (ref 0.3–1.2)
Total Protein: 5.9 g/dL — ABNORMAL LOW (ref 6.5–8.1)

## 2015-05-30 LAB — CBC WITH DIFFERENTIAL/PLATELET
BASOS PCT: 0 %
Basophils Absolute: 0 10*3/uL (ref 0.0–0.1)
EOS ABS: 0.1 10*3/uL (ref 0.0–0.7)
EOS PCT: 1 %
HCT: 31.4 % — ABNORMAL LOW (ref 39.0–52.0)
Hemoglobin: 10.3 g/dL — ABNORMAL LOW (ref 13.0–17.0)
LYMPHS ABS: 0.9 10*3/uL (ref 0.7–4.0)
Lymphocytes Relative: 9 %
MCH: 26.8 pg (ref 26.0–34.0)
MCHC: 32.8 g/dL (ref 30.0–36.0)
MCV: 81.8 fL (ref 78.0–100.0)
Monocytes Absolute: 1.1 10*3/uL — ABNORMAL HIGH (ref 0.1–1.0)
Monocytes Relative: 11 %
Neutro Abs: 7.8 10*3/uL — ABNORMAL HIGH (ref 1.7–7.7)
Neutrophils Relative %: 79 %
PLATELETS: 288 10*3/uL (ref 150–400)
RBC: 3.84 MIL/uL — AB (ref 4.22–5.81)
RDW: 12.6 % (ref 11.5–15.5)
WBC: 9.8 10*3/uL (ref 4.0–10.5)

## 2015-05-30 LAB — RAPID URINE DRUG SCREEN, HOSP PERFORMED
Amphetamines: NOT DETECTED
Barbiturates: NOT DETECTED
Benzodiazepines: NOT DETECTED
Cocaine: NOT DETECTED
Opiates: POSITIVE — AB
Tetrahydrocannabinol: NOT DETECTED

## 2015-05-30 LAB — GLUCOSE, CAPILLARY
GLUCOSE-CAPILLARY: 107 mg/dL — AB (ref 65–99)
GLUCOSE-CAPILLARY: 120 mg/dL — AB (ref 65–99)
Glucose-Capillary: 110 mg/dL — ABNORMAL HIGH (ref 65–99)
Glucose-Capillary: 123 mg/dL — ABNORMAL HIGH (ref 65–99)

## 2015-05-30 LAB — URINE CULTURE: Culture: 30000

## 2015-05-30 LAB — RENAL FUNCTION PANEL
ANION GAP: 12 (ref 5–15)
Albumin: 2.7 g/dL — ABNORMAL LOW (ref 3.5–5.0)
BUN: 14 mg/dL (ref 6–20)
CHLORIDE: 102 mmol/L (ref 101–111)
CO2: 23 mmol/L (ref 22–32)
Calcium: 9.4 mg/dL (ref 8.9–10.3)
Creatinine, Ser: 2.99 mg/dL — ABNORMAL HIGH (ref 0.61–1.24)
GFR calc non Af Amer: 29 mL/min — ABNORMAL LOW (ref >60)
GFR, EST AFRICAN AMERICAN: 34 mL/min — AB (ref >60)
GLUCOSE: 125 mg/dL — AB (ref 65–99)
POTASSIUM: 4 mmol/L (ref 3.5–5.1)
Phosphorus: 5.3 mg/dL — ABNORMAL HIGH (ref 2.5–4.6)
Sodium: 137 mmol/L (ref 135–145)

## 2015-05-30 LAB — IRON AND TIBC
IRON: 12 ug/dL — AB (ref 45–182)
Saturation Ratios: 5 % — ABNORMAL LOW (ref 17.9–39.5)
TIBC: 218 ug/dL — AB (ref 250–450)
UIBC: 206 ug/dL

## 2015-05-30 LAB — TRANSFERRIN: TRANSFERRIN: 156 mg/dL — AB (ref 180–329)

## 2015-05-30 LAB — FOLATE: Folate: 12.7 ng/mL (ref >5.9)

## 2015-05-30 LAB — WOUND CULTURE: Culture: NO GROWTH

## 2015-05-30 LAB — RETICULOCYTES
RBC.: 3.84 MIL/uL — ABNORMAL LOW (ref 4.22–5.81)
Retic Count, Absolute: 57.6 10*3/uL (ref 19.0–186.0)
Retic Ct Pct: 1.5 % (ref 0.4–3.1)

## 2015-05-30 LAB — MAGNESIUM: MAGNESIUM: 1.7 mg/dL (ref 1.7–2.4)

## 2015-05-30 LAB — TSH: TSH: 2.035 u[IU]/mL (ref 0.350–4.500)

## 2015-05-30 LAB — PREALBUMIN: Prealbumin: 13 mg/dL — ABNORMAL LOW (ref 18–38)

## 2015-05-30 LAB — VITAMIN B12: VITAMIN B 12: 437 pg/mL (ref 180–914)

## 2015-05-30 LAB — PHOSPHORUS: PHOSPHORUS: 5.6 mg/dL — AB (ref 2.5–4.6)

## 2015-05-30 LAB — VANCOMYCIN, RANDOM: VANCOMYCIN RM: 31 ug/mL

## 2015-05-30 LAB — HIV ANTIBODY (ROUTINE TESTING W REFLEX): HIV Screen 4th Generation wRfx: NONREACTIVE

## 2015-05-30 LAB — T4, FREE: FREE T4: 0.99 ng/dL (ref 0.61–1.12)

## 2015-05-30 LAB — CALCIUM, IONIZED: Calcium, Ionized, Serum: 5.2 mg/dL (ref 4.5–5.6)

## 2015-05-30 LAB — FERRITIN: Ferritin: 149 ng/mL (ref 24–336)

## 2015-05-30 MED ORDER — VITAMIN D (ERGOCALCIFEROL) 1.25 MG (50000 UNIT) PO CAPS
50000.0000 [IU] | ORAL_CAPSULE | ORAL | Status: DC
  Administered 2015-05-30 – 2015-06-13 (×3): 50000 [IU] via ORAL
  Filled 2015-05-30 (×3): qty 1

## 2015-05-30 MED ORDER — VITAMIN D 1000 UNITS PO TABS
1000.0000 [IU] | ORAL_TABLET | Freq: Two times a day (BID) | ORAL | Status: DC
  Administered 2015-05-30 – 2015-06-19 (×40): 1000 [IU] via ORAL
  Filled 2015-05-30 (×40): qty 1

## 2015-05-30 MED ORDER — ASCORBIC ACID 500 MG PO TABS: 500.0000 mg | ORAL_TABLET | Freq: Two times a day (BID) | ORAL | Status: AC

## 2015-05-30 MED ORDER — LIVING WELL WITH DIABETES BOOK
Freq: Once | Status: AC
  Administered 2015-05-30: 16:00:00
  Filled 2015-05-30: qty 1

## 2015-05-30 MED ORDER — METHOCARBAMOL 500 MG PO TABS: 500.0000 mg | ORAL_TABLET | Freq: Four times a day (QID) | ORAL | Status: DC | PRN

## 2015-05-30 MED ORDER — ENOXAPARIN SODIUM 60 MG/0.6ML ~~LOC~~ SOLN
40.0000 mg | SUBCUTANEOUS | Status: DC
Start: 2015-05-30 — End: 2015-10-16

## 2015-05-30 MED ORDER — RIFAMPIN 300 MG PO CAPS: 300.0000 mg | ORAL_CAPSULE | Freq: Every day | ORAL | Status: DC

## 2015-05-30 MED ORDER — OXYCODONE HCL 5 MG PO TABS: 5.0000 mg | ORAL_TABLET | Freq: Four times a day (QID) | ORAL | Status: DC | PRN

## 2015-05-30 MED ORDER — VITAMIN D (ERGOCALCIFEROL) 1.25 MG (50000 UNIT) PO CAPS: 50000.0000 [IU] | ORAL_CAPSULE | ORAL | Status: AC

## 2015-05-30 MED ORDER — CHOLECALCIFEROL 25 MCG (1000 UT) PO TABS: 1000.0000 [IU] | ORAL_TABLET | Freq: Two times a day (BID) | ORAL | Status: AC

## 2015-05-30 MED ORDER — DOCUSATE SODIUM 100 MG PO CAPS: 100.0000 mg | ORAL_CAPSULE | Freq: Two times a day (BID) | ORAL | Status: DC

## 2015-05-30 MED ORDER — VITAMIN C 500 MG PO TABS
500.0000 mg | ORAL_TABLET | Freq: Two times a day (BID) | ORAL | Status: DC
  Administered 2015-05-30 – 2015-06-19 (×40): 500 mg via ORAL
  Filled 2015-05-30 (×40): qty 1

## 2015-05-30 MED ORDER — SODIUM CHLORIDE 0.9 % IJ SOLN
10.0000 mL | INTRAMUSCULAR | Status: DC | PRN
  Administered 2015-05-31 – 2015-06-19 (×9): 10 mL
  Filled 2015-05-30 (×9): qty 40

## 2015-05-30 NOTE — Progress Notes (Signed)
Peripherally Inserted Central Catheter/Midline Placement  The IV Nurse has discussed with the patient and/or persons authorized to consent for the patient, the purpose of this procedure and the potential benefits and risks involved with this procedure.  The benefits include less needle sticks, lab draws from the catheter and patient may be discharged home with the catheter.  Risks include, but not limited to, infection, bleeding, blood clot (thrombus formation), and puncture of an artery; nerve damage and irregular heat beat.  Alternatives to this procedure were also discussed.  PICC/Midline Placement Documentation        Timmothy Sours 05/30/2015, 2:11 PM

## 2015-05-30 NOTE — Progress Notes (Signed)
Orthopedic Tech Progress Note Patient Details:  Mathew Norman 04-06-1996 [id number]  Patient ID: [name], male   DOB: [birth year], 19 y.o.   MRN: [id number]   [name] 05/30/2015, 12:58 PMTrapeze bar

## 2015-05-30 NOTE — Care Management Note (Signed)
Case Management Note  Patient Details  Name: Mathew Norman MRN: [id number] Date of Birth: 1995/09/27  Subjective/Objective:                    Action/Plan:   Expected Discharge Date:                  Expected Discharge Plan:  Home w Home Health Services  In-House Referral:     Discharge planning Services  CM Consult  Post Acute Care Choice:    Choice offered to:  Patient  DME Arranged:    DME Agency:     HH Arranged:  RN, IV Antibiotics HH Agency:  [facility]  Status of Service:  In process, will continue to follow  Medicare Important Message Given:    Date Medicare IM Given:    Medicare IM give by:    Date Additional Medicare IM Given:    Additional Medicare Important Message give by:     If discussed at Long Length of Stay Meetings, dates discussed:    Additional Comments:  Kingsley Plan, RN 05/30/2015, 10:37 AM

## 2015-05-30 NOTE — Progress Notes (Signed)
INFECTIOUS DISEASE PROGRESS NOTE  ID: Mathew Norman is a 19 y.o. male with  Principal Problem:   Osteomyelitis, pelvis Active Problems:   Prediabetes   Acute renal insufficiency   Iron deficiency   Vitamin D deficiency  Subjective: Getting PIC.   Abtx:  Anti-infectives    Start     Dose/Rate Route Frequency Ordered Stop   05/30/15 1000  rifampin (RIFADIN) capsule 300 mg     300 mg Oral Daily 05/29/15 1626     05/30/15 0000  rifampin (RIFADIN) 300 MG capsule     300 mg Oral Daily 05/30/15 0957     05/29/15 0906  tobramycin (NEBCIN) powder  Status:  Discontinued       As needed 05/29/15 0906 05/29/15 0942   05/29/15 0906  vancomycin (VANCOCIN) powder  Status:  Discontinued       As needed 05/29/15 0907 05/29/15 0942   05/28/15 1700  vancomycin (VANCOCIN) IVPB 1000 mg/200 mL premix  Status:  Discontinued     1,000 mg 200 mL/hr over 60 Minutes Intravenous Every 8 hours 05/28/15 0850 05/30/15 1221   05/28/15 1030  piperacillin-tazobactam (ZOSYN) IVPB 3.375 g  Status:  Discontinued     3.375 g 12.5 mL/hr over 240 Minutes Intravenous 3 times per day 05/28/15 1017 05/29/15 1626   05/28/15 1000  rifampin (RIFADIN) capsule 600 mg  Status:  Discontinued     600 mg Oral Daily 05/28/15 0805 05/29/15 1626   05/28/15 0930  vancomycin (VANCOCIN) 2,000 mg in sodium chloride 0.9 % 500 mL IVPB     2,000 mg 250 mL/hr over 120 Minutes Intravenous  Once 05/28/15 0850 05/28/15 1346   05/27/15 1754  vancomycin (VANCOCIN) powder  Status:  Discontinued       As needed 05/27/15 1754 05/27/15 1837   05/27/15 1753  tobramycin (NEBCIN) powder  Status:  Discontinued       As needed 05/27/15 1753 05/27/15 1837      Medications:  Scheduled: . cholecalciferol  1,000 Units Oral BID  . docusate sodium  100 mg Oral BID  . enoxaparin (LOVENOX) injection  60 mg Subcutaneous Q24H  . insulin aspart  0-15 Units Subcutaneous TID WC  . living well with diabetes book   Does not apply Once  . rifampin  300  mg Oral Daily  . vitamin C  500 mg Oral BID  . Vitamin D (Ergocalciferol)  50,000 Units Oral Q7 days    Objective: Vital signs in last 24 hours: Temp:  [99 F (37.2 C)-101.4 F (38.6 C)] 99.7 F (37.6 C) (09/16 1000) Pulse Rate:  [100-109] 102 (09/16 1000) Resp:  [16-17] 17 (09/16 0557) BP: (137-143)/(64-70) 138/70 mmHg (09/16 1000) SpO2:  [94 %-98 %] 98 % (09/16 1000)   getting [facility]  Lab Results  Recent Labs  05/29/15 0352 05/30/15 0530 05/30/15 1102  WBC 5.8 9.8  --   HGB 10.2* 10.3*  --   HCT 31.2* 31.4*  --   NA 140 135 137  K 3.6 3.9 4.0  CL 103 100* 102  CO2 30 24 23   BUN 7 12 14   CREATININE 0.87 2.76* 2.99*   Liver Panel  Recent Labs  05/29/15 0352 05/30/15 0530 05/30/15 1102  PROT 6.5 5.9*  --   ALBUMIN 2.7* 2.6* 2.7*  AST 20 36  --   ALT 18 21  --   ALKPHOS 54 55  --   BILITOT 0.5 0.6  --    Sedimentation Rate No  results for input(s): ESRSEDRATE in the last 72 hours. C-Reactive Protein No results for input(s): CRP in the last 72 hours.  Microbiology: Recent Results (from the past 240 hour(s))  Culture, blood (routine x 2)     Status: None (Preliminary result)   Collection Time: 05/26/15  6:35 AM  Result Value Ref Range Status   Specimen Description BLOOD LEFT ANTECUBITAL  Final   Special Requests BOTTLES DRAWN AEROBIC AND ANAEROBIC 5CC  Final   Culture NO GROWTH 4 DAYS  Final   Report Status PENDING  Incomplete  Culture, blood (routine x 2)     Status: None (Preliminary result)   Collection Time: 05/26/15  6:42 AM  Result Value Ref Range Status   Specimen Description BLOOD RIGHT ANTECUBITAL  Final   Special Requests BOTTLES DRAWN AEROBIC AND ANAEROBIC 5CC  Final   Culture NO GROWTH 4 DAYS  Final   Report Status PENDING  Incomplete  Urine culture     Status: None   Collection Time: 05/26/15  7:34 AM  Result Value Ref Range Status   Specimen Description URINE, CLEAN CATCH  Final   Special Requests NONE  Final   Culture MULTIPLE SPECIES  PRESENT, SUGGEST RECOLLECTION  Final   Report Status 05/27/2015 FINAL  Final  Anaerobic culture     Status: None (Preliminary result)   Collection Time: 05/27/15  5:39 PM  Result Value Ref Range Status   Specimen Description WOUND PELVIS  Final   Special Requests NONE  Final   Gram Stain   Final    MODERATE WBC PRESENT, PREDOMINANTLY PMN NO SQUAMOUS EPITHELIAL CELLS SEEN MODERATE GRAM POSITIVE COCCI IN PAIRS Performed at [facility]    Culture   Final    NO ANAEROBES ISOLATED; CULTURE IN PROGRESS FOR 5 DAYS Performed at [facility]    Report Status PENDING  Incomplete  Gram stain     Status: None   Collection Time: 05/27/15  5:39 PM  Result Value Ref Range Status   Specimen Description WOUND PELVIS  Final   Special Requests NONE  Final   Gram Stain   Final    FEW WBC PRESENT, PREDOMINANTLY MONONUCLEAR FEW GRAM POSITIVE COCCI IN PAIRS CRITICAL RESULT CALLED TO, READ BACK BY AND VERIFIED WITH: N IRISH  05/28/15 MKELLY    Report Status 05/28/2015 FINAL  Final  Wound culture     Status: None   Collection Time: 05/27/15  5:39 PM  Result Value Ref Range Status   Specimen Description WOUND PELVIS  Final   Special Requests NONE  Final   Gram Stain   Final    MODERATE WBC PRESENT, PREDOMINANTLY PMN NO SQUAMOUS EPITHELIAL CELLS SEEN MODERATE GRAM POSITIVE COCCI IN PAIRS Performed at [facility]    Culture   Final    NO GROWTH 2 DAYS Performed at [facility]    Report Status 05/30/2015 FINAL  Final  Culture, Urine     Status: None   Collection Time: 05/28/15  2:44 AM  Result Value Ref Range Status   Specimen Description URINE, CLEAN CATCH  Final   Special Requests NONE  Final   Culture 30,000 COLONIES/mL PROTEUS SPECIES  Final   Report Status 05/30/2015 FINAL  Final   Organism ID, Bacteria PROTEUS SPECIES  Final      Susceptibility   Proteus species - MIC*    AMPICILLIN >=32 RESISTANT Resistant     CEFAZOLIN >=64 RESISTANT  Resistant     CEFTRIAXONE <=1  SENSITIVE Sensitive     CIPROFLOXACIN <=0.25 SENSITIVE Sensitive     GENTAMICIN <=1 SENSITIVE Sensitive     IMIPENEM 4 SENSITIVE Sensitive     NITROFURANTOIN 128 RESISTANT Resistant     TRIMETH/SULFA <=20 SENSITIVE Sensitive     AMPICILLIN/SULBACTAM 16 INTERMEDIATE Intermediate     PIP/TAZO <=4 SENSITIVE Sensitive     * 30,000 COLONIES/mL PROTEUS SPECIES  Surgical pcr screen     Status: None   Collection Time: 05/29/15  6:11 AM  Result Value Ref Range Status   MRSA, PCR NEGATIVE NEGATIVE Final   Staphylococcus aureus NEGATIVE NEGATIVE Final    Comment:        The Xpert SA Assay (FDA approved for NASAL specimens in patients over 21 years of age), is one component of a comprehensive surveillance program.  Test performance has been validated by [facility] for patients greater than or equal to 27 year old. It is not intended to diagnose infection nor to guide or monitor treatment.     Studies/Results: No results found.   Assessment/Plan: Osteomyelitis of pelvis ARF Fever  Suspect temp due to drug reaction. If continues, stop rifampin Hold vanco Await f/u vanco levels- does not need further anbx dosing til Vanco tr <20 F/u Cr  Wound Cx is (-), despite g/s showing GPC.  Dr Orvan Falconer is available if questions over weekend   Total days of antibiotics: 3 vanco/rifampin         Johny Sax Infectious Diseases (pager) [phone] [email or web address] 05/30/2015, 2:45 PM  LOS: 4 days
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
## 2015-05-30 NOTE — Progress Notes (Signed)
ANTIBIOTIC CONSULT NOTE - FOLLOW UP  Pharmacy Consult for Vancomycin Indication: osteomyelitis  No Known Allergies  Patient Measurements: Height: 5\' 6"  (167.6 cm) Weight: 258 lb (117.028 kg) IBW/kg (Calculated) : 63.8  Vital Signs: Temp: 99.7 F (37.6 C) (09/16 1000) Temp Source: Oral (09/16 1000) BP: 138/70 mmHg (09/16 1000) Pulse Rate: 102 (09/16 1000) Intake/Output from previous day: 09/15 0701 - 09/16 0700 In: 2037 [P.O.:237; I.V.:1800] Out: 2250 [Urine:2200; Blood:50] Intake/Output from this shift: Total I/O In: 873 [P.O.:120; I.V.:753] Out: 1400 [Urine:1400]  Labs:  Recent Labs  05/28/15 0320 05/29/15 0352 05/30/15 0530 05/30/15 1102  WBC 5.5 5.8 9.8  --   HGB 9.8* 10.2* 10.3*  --   PLT 226 267 288  --   CREATININE  --  0.87 2.76* 2.99*   Estimated Creatinine Clearance: 48.2 mL/min (by C-G formula based on Cr of 2.99).  Recent Labs  05/29/15 1615 05/30/15 1335  VANCOTROUGH 18  --   VANCORANDOM  --  31     Microbiology: Recent Results (from the past 720 hour(s))  Culture, Urine     Status: None   Collection Time: 05/01/15  4:35 PM  Result Value Ref Range Status   Specimen Description URINE, CLEAN CATCH  Final   Special Requests NONE  Final   Culture MULTIPLE SPECIES PRESENT, SUGGEST RECOLLECTION  Final   Report Status 05/03/2015 FINAL  Final  Culture, blood (routine x 2)     Status: None (Preliminary result)   Collection Time: 05/26/15  6:35 AM  Result Value Ref Range Status   Specimen Description BLOOD LEFT ANTECUBITAL  Final   Special Requests BOTTLES DRAWN AEROBIC AND ANAEROBIC 5CC  Final   Culture NO GROWTH 4 DAYS  Final   Report Status PENDING  Incomplete  Culture, blood (routine x 2)     Status: None (Preliminary result)   Collection Time: 05/26/15  6:42 AM  Result Value Ref Range Status   Specimen Description BLOOD RIGHT ANTECUBITAL  Final   Special Requests BOTTLES DRAWN AEROBIC AND ANAEROBIC 5CC  Final   Culture NO GROWTH 4 DAYS   Final   Report Status PENDING  Incomplete  Urine culture     Status: None   Collection Time: 05/26/15  7:34 AM  Result Value Ref Range Status   Specimen Description URINE, CLEAN CATCH  Final   Special Requests NONE  Final   Culture MULTIPLE SPECIES PRESENT, SUGGEST RECOLLECTION  Final   Report Status 05/27/2015 FINAL  Final  Anaerobic culture     Status: None (Preliminary result)   Collection Time: 05/27/15  5:39 PM  Result Value Ref Range Status   Specimen Description WOUND PELVIS  Final   Special Requests NONE  Final   Gram Stain   Final    MODERATE WBC PRESENT, PREDOMINANTLY PMN NO SQUAMOUS EPITHELIAL CELLS SEEN MODERATE GRAM POSITIVE COCCI IN PAIRS Performed at [facility]    Culture   Final    NO ANAEROBES ISOLATED; CULTURE IN PROGRESS FOR 5 DAYS Performed at [facility]    Report Status PENDING  Incomplete  Gram stain     Status: None   Collection Time: 05/27/15  5:39 PM  Result Value Ref Range Status   Specimen Description WOUND PELVIS  Final   Special Requests NONE  Final   Gram Stain   Final    FEW WBC PRESENT, PREDOMINANTLY MONONUCLEAR FEW GRAM POSITIVE COCCI IN PAIRS CRITICAL RESULT CALLED TO, READ BACK BY AND VERIFIED WITH: N  IRISH  05/28/15 MKELLY    Report Status 05/28/2015 FINAL  Final  Wound culture     Status: None   Collection Time: 05/27/15  5:39 PM  Result Value Ref Range Status   Specimen Description WOUND PELVIS  Final   Special Requests NONE  Final   Gram Stain   Final    MODERATE WBC PRESENT, PREDOMINANTLY PMN NO SQUAMOUS EPITHELIAL CELLS SEEN MODERATE GRAM POSITIVE COCCI IN PAIRS Performed at [facility]    Culture   Final    NO GROWTH 2 DAYS Performed at [facility]    Report Status 05/30/2015 FINAL  Final  Culture, Urine     Status: None   Collection Time: 05/28/15  2:44 AM  Result Value Ref Range Status   Specimen Description URINE, CLEAN CATCH  Final   Special Requests NONE  Final    Culture 30,000 COLONIES/mL PROTEUS SPECIES  Final   Report Status 05/30/2015 FINAL  Final   Organism ID, Bacteria PROTEUS SPECIES  Final      Susceptibility   Proteus species - MIC*    AMPICILLIN >=32 RESISTANT Resistant     CEFAZOLIN >=64 RESISTANT Resistant     CEFTRIAXONE <=1 SENSITIVE Sensitive     CIPROFLOXACIN <=0.25 SENSITIVE Sensitive     GENTAMICIN <=1 SENSITIVE Sensitive     IMIPENEM 4 SENSITIVE Sensitive     NITROFURANTOIN 128 RESISTANT Resistant     TRIMETH/SULFA <=20 SENSITIVE Sensitive     AMPICILLIN/SULBACTAM 16 INTERMEDIATE Intermediate     PIP/TAZO <=4 SENSITIVE Sensitive     * 30,000 COLONIES/mL PROTEUS SPECIES  Surgical pcr screen     Status: None   Collection Time: 05/29/15  6:11 AM  Result Value Ref Range Status   MRSA, PCR NEGATIVE NEGATIVE Final   Staphylococcus aureus NEGATIVE NEGATIVE Final    Comment:        The Xpert SA Assay (FDA approved for NASAL specimens in patients over 80 years of age), is one component of a comprehensive surveillance program.  Test performance has been validated by [facility] for patients greater than or equal to 17 year old. It is not intended to diagnose infection nor to guide or monitor treatment.     Anti-infectives    Start     Dose/Rate Route Frequency Ordered Stop   05/30/15 1000  rifampin (RIFADIN) capsule 300 mg     300 mg Oral Daily 05/29/15 1626     05/30/15 0000  rifampin (RIFADIN) 300 MG capsule     300 mg Oral Daily 05/30/15 0957     05/29/15 0906  tobramycin (NEBCIN) powder  Status:  Discontinued       As needed 05/29/15 0906 05/29/15 0942   05/29/15 0906  vancomycin (VANCOCIN) powder  Status:  Discontinued       As needed 05/29/15 0907 05/29/15 0942   05/28/15 1700  vancomycin (VANCOCIN) IVPB 1000 mg/200 mL premix  Status:  Discontinued     1,000 mg 200 mL/hr over 60 Minutes Intravenous Every 8 hours 05/28/15 0850 05/30/15 1221   05/28/15 1030  piperacillin-tazobactam (ZOSYN) IVPB 3.375 g  Status:   Discontinued     3.375 g 12.5 mL/hr over 240 Minutes Intravenous 3 times per day 05/28/15 1017 05/29/15 1626   05/28/15 1000  rifampin (RIFADIN) capsule 600 mg  Status:  Discontinued     600 mg Oral Daily 05/28/15 0805 05/29/15 1626   05/28/15 0930  vancomycin (VANCOCIN) 2,000 mg in sodium chloride 0.9 %  500 mL IVPB     2,000 mg 250 mL/hr over 120 Minutes Intravenous  Once 05/28/15 0850 05/28/15 1346   05/27/15 1754  vancomycin (VANCOCIN) powder  Status:  Discontinued       As needed 05/27/15 1754 05/27/15 1837   05/27/15 1753  tobramycin (NEBCIN) powder  Status:  Discontinued       As needed 05/27/15 1753 05/27/15 1837      Assessment: 19 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  He is on day #3 of antibiotics and his SCr has risen markedly.  His Vancomycin random level is 31 indicating accumulation.  Goal of Therapy:  Vancomycin trough level 15-20 mcg/ml  Plan:  Hold Vancomycin Check Vancomycin random level with AM labs 9/17 - redose when <20 BMET with AM labs  Estella Husk, Pharm.D., BCPS, AAHIVP Clinical Pharmacist Phone: [phone] or [phone] 05/30/2015, 3:48 PM

## 2015-05-30 NOTE — Progress Notes (Signed)
[facility] Progress Note  Subjective  Doing okay this morning Found out yesterday that he was laid off from his job  Noted breakfast inpatients room. Again patient is in a carb modified diet with his breakfast tray consisted of biscuits with gravy, eggs and cheese, and peaches in syrup  Review of Systems  Constitutional: Negative for fever and chills.  Eyes: Negative for blurred vision.  Respiratory: Negative for shortness of breath and wheezing.   Cardiovascular: Negative for chest pain and palpitations.  Gastrointestinal: Negative for nausea, vomiting and abdominal pain.  Neurological: Negative for tingling and sensory change.     Objective   BP 142/68 mmHg  Pulse 100  Temp(Src) 99.9 F (37.7 C) (Oral)  Resp 17  Ht 5' 6"  (1.676 m)  Wt 117.028 kg (258 lb)  BMI 41.66 kg/m2  SpO2 97%  Intake/Output      09/15 0701 - 09/16 0700 09/16 0701 - 09/17 0700   P.O. 237    I.V. (mL/kg) 1800 (15.4)    Total Intake(mL/kg) 2037 (17.4)    Urine (mL/kg/hr) 2200 (0.8) 400 (1.4)   Drains     Blood 50 (0)    Total Output 2250 400   Net -213 -400          Labs Results for Mathew Norman, Mathew Norman (MRN [id number]) as of 05/30/2015 09:33  Ref. Range 05/30/2015 05:30  Sodium Latest Ref Range: 135-145 mmol/L 135  Potassium Latest Ref Range: 3.5-5.1 mmol/L 3.9  Chloride Latest Ref Range: 101-111 mmol/L 100 (L)  CO2 Latest Ref Range: 22-32 mmol/L 24  BUN Latest Ref Range: 6-20 mg/dL 12  Creatinine Latest Ref Range: 0.61-1.24 mg/dL 2.76 (H)  Calcium Latest Ref Range: 8.9-10.3 mg/dL 9.0  EGFR (Non-African Amer.) Latest Ref Range: >60 mL/min 32 (L)  EGFR (African American) Latest Ref Range: >60 mL/min 37 (L)  Glucose Latest Ref Range: 65-99 mg/dL 105 (H)  Anion gap Latest Ref Range: 5-15  11  Phosphorus Latest Ref Range: 2.5-4.6 mg/dL 5.6 (H)  Magnesium Latest Ref Range: 1.7-2.4 mg/dL 1.7  Alkaline Phosphatase Latest Ref Range: 38-126 U/L 55  Albumin Latest Ref Range: 3.5-5.0  g/dL 2.6 (L)  AST Latest Ref Range: 15-41 U/L 36  ALT Latest Ref Range: 17-63 U/L 21  Total Protein Latest Ref Range: 6.5-8.1 g/dL 5.9 (L)  Total Bilirubin Latest Ref Range: 0.3-1.2 mg/dL 0.6  Iron Latest Ref Range: 45-182 ug/dL 12 (L)  UIBC Latest Units: ug/dL 206  TIBC Latest Ref Range: 250-450 ug/dL 218 (L)  Saturation Ratios Latest Ref Range: 17.9-39.5 % 5 (L)  Ferritin Latest Ref Range: 24-336 ng/mL 149  Transferrin Latest Ref Range: 180-329 mg/dL 156 (L)  Folate Latest Ref Range: >5.9 ng/mL 12.7  Vitamin B-12 Latest Ref Range: 180-914 pg/mL 437  WBC Latest Ref Range: 4.0-10.5 K/uL 9.8  RBC Latest Ref Range: 4.22-5.81 MIL/uL 3.84 (L)  Hemoglobin Latest Ref Range: 13.0-17.0 g/dL 10.3 (L)  HCT Latest Ref Range: 39.0-52.0 % 31.4 (L)  MCV Latest Ref Range: 78.0-100.0 fL 81.8  MCH Latest Ref Range: 26.0-34.0 pg 26.8  MCHC Latest Ref Range: 30.0-36.0 g/dL 32.8  RDW Latest Ref Range: 11.5-15.5 % 12.6  Platelets Latest Ref Range: 150-400 K/uL 288  Neutrophils Latest Units: % 79  Lymphocytes Latest Units: % 9  Monocytes Relative Latest Units: % 11  Eosinophil Latest Units: % 1  Basophil Latest Units: % 0  NEUT# Latest Ref Range: 1.7-7.7 K/uL 7.8 (H)  Lymphocyte # Latest Ref Range: 0.7-4.0 K/uL 0.9  Monocyte # Latest Ref Range: 0.1-1.0 K/uL 1.1 (H)  Eosinophils Absolute Latest Ref Range: 0.0-0.7 K/uL 0.1  Basophils Absolute Latest Ref Range: 0.0-0.1 K/uL 0.0  RBC. Latest Ref Range: 4.22-5.81 MIL/uL 3.84 (L)  Retic Ct Pct Latest Ref Range: 0.4-3.1 % 1.5  Retic Count, Manual Latest Ref Range: 19.0-186.0 K/uL 57.6    Exam  Gen: Resting comfortably in bed, no acute distress Lungs: Clear anterior fields Cardiac: Regular rate and rhythm Abd: Soft, nontender, nondistended Pelvis: Incisional wound VAC is stable, no real drainage in canister Ext:       Bilateral lower extremities  Extremities are warm  + Peripheral pulses  Motor and sensory functions are intact  No significant  notable swelling    Assessment and Plan   POD/HD#: 1   19 y/o male with pelvic abscess and osteomyelitis 4 weeks s/p ORIF pelvic ring and L acetabulum   1. Pelvic abscess and osteomyelitis with retained hardware s/p I&D                         Bed to chair as tolerated                         NWB L Leg                         WBAT R leg  Dressing change tomorrow- Aquacel dressing after VAC removed               patient on vancomycin and rifampin, PICC line has been ordered   Appreciate ID consult                        2. Pain management:             Continue with current regimen  3. ABL anemia/Hemodynamics             Stable  4. Medical issues               Pre-diabetes                         Had discussion about food choices                         Will have RD see pt                                       No sure how biscuits and gravy is permitted on carb mod diet?    So far while on a carb modified diet patient has had cake, sugary fruit and biscuits and gravy    Acute renal insufficiency   Worsened creatinine and GFR today- will increase fluids, likely needs an adjustment in vancomycin. Defer to pharmacist and ID service'   Iron Deficiency    Pt with low iron and iron stores   Given presence of active infection will hold on supplementation   Recheck iron/ferritn/transferrin levels after infection cleared   5. DVT/PE prophylaxis:             Lovenox while inpt  Lovenox 3 weeks and discharge  6. ID:               See #1  Vanc and rifampin    7. FEN/Foley/Lines:             CHO mod diet                 8. Dispo:                         Need to arrange for [facility] and home abx    PICC line  Likely DC home over weekend if renal function improves and all home health needs are arranged   Jari Pigg, PA-C [facility] [phone] (O) 05/30/2015 9:32 AM

## 2015-05-30 NOTE — Progress Notes (Signed)
Brief Nutrition Follow-Up/ Education Note  RD consulted for nutrition education regarding diabetes.   Lab Results  Component Value Date   HGBA1C 5.8* 05/26/2015   Spoke with pt at length at bedside. He describes his appetite as "fair". He confirms with this RD that he has a hx of HTN and heart disease in his family, however, is unsure of family hx of diabetes. CBGS have ranged 97-134 throughout admission (reading of 123 was taken during visit by nurse tech). Noted most recent Hgb A1c reading 5.8 (taken 05/26/15). Previous Hgb A1c 6.1 on 04/28/15, suggesting improvement in glycemic control between hospitalizations.  Pt reports he has actively been trying to lose weight PTA by being more mindful of what he eats. PTA meal intake was 2-3 meals per day- Breakfast: cereal (frosted flakes), Lunch and Dinner: hamburger or hot dog, potatoes. Pt reports he used to go for multiple servings at each meal, but now does not go for seconds. Beverages consist of water, gatorade, and gingerale. He was very inquisitive and asked many questions about ways to transition to a healthier diet upon discharge to assist with weight loss and better glycemic control. Spent over 25 minutes discussing options with pt including options for low-calorie beverages, transition to whole grains/complex carbs, and eating more whole fruits and vegetables instead of fruit. Pt expressed interest in packing his lunch when he returns to work instead of purchasing fast food. He reveals his sister shops for him and will purchase whatever he requests on his shopping list.   Per chart review, pt with wound vac, which is expected to be removed tomorrow. Plan is to d/c home with home health and IV antibiotics.  RD provided "Carbohydrate Counting for People with Diabetes" handout from the Academy of [employer] and Dietetics. Discussed different food groups and their effects on blood sugar, emphasizing carbohydrate-containing foods. Provided list of  carbohydrates and recommended serving sizes of common foods.  Discussed importance of controlled and consistent carbohydrate intake throughout the day. Provided examples of ways to balance meals/snacks and encouraged intake of high-fiber, whole grain complex carbohydrates. Teach back method used.  Expect fair to good compliance.  Patients who receive carb modified diets receive 3 meals per day; diet provides 1600-2000 calories per day (60-75g CHO per meal). Patients have flexibility with food choices as long as pt's meal is within carbohydrate range of 60-75 grams per meal. Sugar-free and no sugar added items such as canned fruit (in light syrup or juice), pudding, jello, and ice cream are given in substitution for traditional products as available. Carbohydrates are equally distributed at each meal in attempt to optimize glycemic control, in collaboration with medications and glycemic monitoring therapies.   Body mass index is 41.66 kg/(m^2). Pt meets criteria for extreme obesity, class III based on current BMI.  Current diet order is Carb Modified, patient is consuming approximately n/a% of meals at this time. Labs and medications reviewed. No further nutrition interventions warranted at this time. RD contact information provided. If additional nutrition issues arise, please re-consult RD.  Jenifer A. Mayford Knife, RD, LDN, CDE Pager: [phone] After hours Pager: [phone]

## 2015-05-30 NOTE — Discharge Instructions (Addendum)
[facility]edic [facility]uma Service Discharge Instructions   General Discharge Instructions  WEIGHT BEARING STATUS: Weightbearing as tolerated right leg, Weightbear as tolerated Left leg   RANGE OF MOTION/ACTIVITY: As tolerated   PAIN MEDICATION USE AND EXPECTATIONS  You have likely been given narcotic medications to help control your pain.  After a traumatic event that results in an fracture (broken bone) with or without surgery, it is ok to use narcotic pain medications to help control one's pain.  We understand that everyone responds to pain differently and each individual patient will be evaluated on a regular basis for the continued need for narcotic medications. Ideally, narcotic medication use should last no more than 6-8 weeks (coinciding with fracture healing).   As a patient it is your responsibility as well to monitor narcotic medication use and report the amount and frequency you use these medications when you come to your office visit.   We would also advise that if you are using narcotic medications, you should take a dose prior to therapy to maximize you participation.  IF YOU ARE ON NARCOTIC MEDICATIONS IT IS NOT PERMISSIBLE TO OPERATE A MOTOR VEHICLE (MOTORCYCLE/CAR/TRUCK/MOPED) OR HEAVY MACHINERY DO NOT MIX NARCOTICS WITH OTHER CNS (CENTRAL NERVOUS SYSTEM) DEPRESSANTS SUCH AS ALCOHOL  Diet: as you were eating previously.  Can use over the counter stool softeners and bowel preparations, such as Miralax, to help with bowel movements.  Narcotics can be constipating.  Be sure to drink plenty of fluids  Wound Care: leave aquacel dressing on until follow up. Come to office on 06/25/2015  STOP SMOKING OR USING NICOTINE PRODUCTS!!!!  As discussed nicotine severely impairs your body's ability to heal surgical and traumatic wounds but also impairs bone healing.  Wounds and bone heal by forming microscopic blood vessels (angiogenesis) and nicotine is a vasoconstrictor (essentially, shrinks blood  vessels).  Therefore, if vasoconstriction occurs to these microscopic blood vessels they essentially disappear and are unable to deliver necessary nutrients to the healing tissue.  This is one modifiable factor that you can do to dramatically increase your chances of healing your injury.    (This means no smoking, no nicotine gum, patches, etc)  DO NOT USE NONSTEROIDAL ANTI-INFLAMMATORY DRUGS (NSAID'S)  Using products such as Advil (ibuprofen), Aleve (naproxen), Motrin (ibuprofen) for additional pain control during fracture healing can delay and/or prevent the healing response.  If you would like to take over the counter (OTC) medication, Tylenol (acetaminophen) is ok.  However, some narcotic medications that are given for pain control contain acetaminophen as well. Therefore, you should not exceed more than 4000 mg of tylenol in a day if you do not have liver disease.  Also note that there are may OTC medicines, such as cold medicines and allergy medicines that my contain tylenol as well.  If you have any questions about medications and/or interactions please ask your doctor/PA or your pharmacist.      ICE AND ELEVATE INJURED/OPERATIVE EXTREMITY  Using ice and elevating the injured extremity above your heart can help with swelling and pain control.  Icing in a pulsatile fashion, such as 20 minutes on and 20 minutes off, can be followed.    Do not place ice directly on skin. Make sure there is a barrier between to skin and the ice pack.    Using frozen items such as frozen peas works well as the conform nicely to the are that needs to be iced.  USE AN ACE WRAP OR TED HOSE FOR SWELLING CONTROL  In addition to icing and elevation, Ace wraps or TED hose are used to help limit and resolve swelling.  It is recommended to use Ace wraps or TED hose until you are informed to stop.    When using Ace Wraps start the wrapping distally (farthest away from the body) and wrap proximally (closer to the  body)   Example: If you had surgery on your leg or thing and you do not have a splint on, start the ace wrap at the toes and work your way up to the thigh        If you had surgery on your upper extremity and do not have a splint on, start the ace wrap at your fingers and work your way up to the upper arm  IF YOU ARE IN A SPLINT OR CAST DO NOT REMOVE IT FOR ANY REASON   If your splint gets wet for any reason please contact the office immediately. You may shower in your splint or cast as long as you keep it dry.  This can be done by wrapping in a cast cover or garbage back (or similar)  Do Not stick any thing down your splint or cast such as pencils, money, or hangers to try and scratch yourself with.  If you feel itchy take benadryl as prescribed on the bottle for itching  IF YOU ARE IN A CAM BOOT (BLACK BOOT)  You may remove boot periodically. Perform daily dressing changes as noted below.  Wash the liner of the boot regularly and wear a sock when wearing the boot. It is recommended that you sleep in the boot until told otherwise  CALL THE OFFICE WITH ANY QUESTIONS OR CONCERTS: [phone]

## 2015-05-31 LAB — HEPATIC FUNCTION PANEL
ALT: 26 U/L (ref 17–63)
AST: 34 U/L (ref 15–41)
Albumin: 2.5 g/dL — ABNORMAL LOW (ref 3.5–5.0)
Alkaline Phosphatase: 53 U/L (ref 38–126)
BILIRUBIN TOTAL: 0.7 mg/dL (ref 0.3–1.2)
Total Protein: 6.3 g/dL — ABNORMAL LOW (ref 6.5–8.1)

## 2015-05-31 LAB — BASIC METABOLIC PANEL
Anion gap: 8 (ref 5–15)
BUN: 18 mg/dL (ref 6–20)
CALCIUM: 9.1 mg/dL (ref 8.9–10.3)
CO2: 24 mmol/L (ref 22–32)
CREATININE: 2.95 mg/dL — AB (ref 0.61–1.24)
Chloride: 104 mmol/L (ref 101–111)
GFR calc Af Amer: 34 mL/min — ABNORMAL LOW (ref >60)
GFR calc non Af Amer: 29 mL/min — ABNORMAL LOW (ref >60)
GLUCOSE: 110 mg/dL — AB (ref 65–99)
Potassium: 4.2 mmol/L (ref 3.5–5.1)
Sodium: 136 mmol/L (ref 135–145)

## 2015-05-31 LAB — CULTURE, BLOOD (ROUTINE X 2)
CULTURE: NO GROWTH
Culture: NO GROWTH

## 2015-05-31 LAB — GLUCOSE, CAPILLARY
GLUCOSE-CAPILLARY: 89 mg/dL (ref 65–99)
Glucose-Capillary: 101 mg/dL — ABNORMAL HIGH (ref 65–99)
Glucose-Capillary: 101 mg/dL — ABNORMAL HIGH (ref 65–99)
Glucose-Capillary: 131 mg/dL — ABNORMAL HIGH (ref 65–99)

## 2015-05-31 LAB — VANCOMYCIN, RANDOM: Vancomycin Rm: 13 ug/mL

## 2015-05-31 LAB — PTH, INTACT AND CALCIUM
Calcium, Total (PTH): 8.7 mg/dL (ref 8.7–10.2)
PTH: 11 pg/mL — ABNORMAL LOW (ref 15–65)

## 2015-05-31 LAB — TESTOSTERONE: TESTOSTERONE: 146 ng/dL

## 2015-05-31 LAB — TESTOSTERONE, FREE: Testosterone, Free: 5 pg/mL

## 2015-05-31 LAB — T3, FREE: T3 FREE: 1.8 pg/mL — AB (ref 2.3–5.0)

## 2015-05-31 LAB — SEX HORMONE BINDING GLOBULIN: Sex Hormone Binding: 24.5 nmol/L (ref 16.5–55.9)

## 2015-05-31 MED ORDER — POLYETHYLENE GLYCOL 3350 17 G PO PACK
17.0000 g | PACK | Freq: Two times a day (BID) | ORAL | Status: DC
  Administered 2015-05-31 – 2015-06-11 (×4): 17 g via ORAL
  Filled 2015-05-31 (×24): qty 1

## 2015-05-31 MED ORDER — SODIUM CHLORIDE 0.9 % IV SOLN
1500.0000 mg | INTRAVENOUS | Status: DC
  Administered 2015-05-31 – 2015-06-02 (×3): 1500 mg via INTRAVENOUS
  Filled 2015-05-31 (×3): qty 1500

## 2015-05-31 NOTE — Progress Notes (Signed)
ANTIBIOTIC CONSULT NOTE - FOLLOW UP  Pharmacy Consult for Vancomycin Indication: osteomyelitis  No Known Allergies  Patient Measurements: Height: 5\' 6"  (167.6 cm) Weight: 258 lb (117.028 kg) IBW/kg (Calculated) : 63.8  Vital Signs: Temp: 99.1 F (37.3 C) (09/17 0600) Temp Source: Oral (09/17 0600) BP: 143/72 mmHg (09/17 0600) Pulse Rate: 98 (09/17 0600) Intake/Output from previous day: 09/16 0701 - 09/17 0700 In: 1913 [P.O.:360; I.V.:1553] Out: 3125 [Urine:3125] Intake/Output from this shift: Total I/O In: 600 [P.O.:600] Out: 725 [Urine:725]  Labs:  Recent Labs  05/29/15 0352 05/30/15 0530 05/30/15 1102 05/31/15 0528  WBC 5.8 9.8  --   --   HGB 10.2* 10.3*  --   --   PLT 267 288  --   --   CREATININE 0.87 2.76* 2.99* 2.95*   Estimated Creatinine Clearance: 48.9 mL/min (by C-G formula based on Cr of 2.95).  Recent Labs  05/29/15 1615 05/30/15 1335 05/31/15 0915  VANCOTROUGH 18  --   --   VANCORANDOM  --  31 13     Microbiology: Recent Results (from the past 720 hour(s))  Culture, Urine     Status: None   Collection Time: 05/01/15  4:35 PM  Result Value Ref Range Status   Specimen Description URINE, CLEAN CATCH  Final   Special Requests NONE  Final   Culture MULTIPLE SPECIES PRESENT, SUGGEST RECOLLECTION  Final   Report Status 05/03/2015 FINAL  Final  Culture, blood (routine x 2)     Status: None (Preliminary result)   Collection Time: 05/26/15  6:35 AM  Result Value Ref Range Status   Specimen Description BLOOD LEFT ANTECUBITAL  Final   Special Requests BOTTLES DRAWN AEROBIC AND ANAEROBIC 5CC  Final   Culture NO GROWTH 4 DAYS  Final   Report Status PENDING  Incomplete  Culture, blood (routine x 2)     Status: None (Preliminary result)   Collection Time: 05/26/15  6:42 AM  Result Value Ref Range Status   Specimen Description BLOOD RIGHT ANTECUBITAL  Final   Special Requests BOTTLES DRAWN AEROBIC AND ANAEROBIC 5CC  Final   Culture NO GROWTH 4 DAYS   Final   Report Status PENDING  Incomplete  Urine culture     Status: None   Collection Time: 05/26/15  7:34 AM  Result Value Ref Range Status   Specimen Description URINE, CLEAN CATCH  Final   Special Requests NONE  Final   Culture MULTIPLE SPECIES PRESENT, SUGGEST RECOLLECTION  Final   Report Status 05/27/2015 FINAL  Final  Anaerobic culture     Status: None (Preliminary result)   Collection Time: 05/27/15  5:39 PM  Result Value Ref Range Status   Specimen Description WOUND PELVIS  Final   Special Requests NONE  Final   Gram Stain   Final    MODERATE WBC PRESENT, PREDOMINANTLY PMN NO SQUAMOUS EPITHELIAL CELLS SEEN MODERATE GRAM POSITIVE COCCI IN PAIRS Performed at [facility]    Culture   Final    NO ANAEROBES ISOLATED; CULTURE IN PROGRESS FOR 5 DAYS Performed at [facility]    Report Status PENDING  Incomplete  Gram stain     Status: None   Collection Time: 05/27/15  5:39 PM  Result Value Ref Range Status   Specimen Description WOUND PELVIS  Final   Special Requests NONE  Final   Gram Stain   Final    FEW WBC PRESENT, PREDOMINANTLY MONONUCLEAR FEW GRAM POSITIVE COCCI IN PAIRS CRITICAL RESULT CALLED  TO, READ BACK BY AND VERIFIED WITH: N IRISH  05/28/15 MKELLY    Report Status 05/28/2015 FINAL  Final  Wound culture     Status: None   Collection Time: 05/27/15  5:39 PM  Result Value Ref Range Status   Specimen Description WOUND PELVIS  Final   Special Requests NONE  Final   Gram Stain   Final    MODERATE WBC PRESENT, PREDOMINANTLY PMN NO SQUAMOUS EPITHELIAL CELLS SEEN MODERATE GRAM POSITIVE COCCI IN PAIRS Performed at [facility]    Culture   Final    NO GROWTH 2 DAYS Performed at [facility]    Report Status 05/30/2015 FINAL  Final  Culture, Urine     Status: None   Collection Time: 05/28/15  2:44 AM  Result Value Ref Range Status   Specimen Description URINE, CLEAN CATCH  Final   Special Requests NONE  Final    Culture 30,000 COLONIES/mL PROTEUS SPECIES  Final   Report Status 05/30/2015 FINAL  Final   Organism ID, Bacteria PROTEUS SPECIES  Final      Susceptibility   Proteus species - MIC*    AMPICILLIN >=32 RESISTANT Resistant     CEFAZOLIN >=64 RESISTANT Resistant     CEFTRIAXONE <=1 SENSITIVE Sensitive     CIPROFLOXACIN <=0.25 SENSITIVE Sensitive     GENTAMICIN <=1 SENSITIVE Sensitive     IMIPENEM 4 SENSITIVE Sensitive     NITROFURANTOIN 128 RESISTANT Resistant     TRIMETH/SULFA <=20 SENSITIVE Sensitive     AMPICILLIN/SULBACTAM 16 INTERMEDIATE Intermediate     PIP/TAZO <=4 SENSITIVE Sensitive     * 30,000 COLONIES/mL PROTEUS SPECIES  Surgical pcr screen     Status: None   Collection Time: 05/29/15  6:11 AM  Result Value Ref Range Status   MRSA, PCR NEGATIVE NEGATIVE Final   Staphylococcus aureus NEGATIVE NEGATIVE Final    Comment:        The Xpert SA Assay (FDA approved for NASAL specimens in patients over 20 years of age), is one component of a comprehensive surveillance program.  Test performance has been validated by [facility] for patients greater than or equal to 15 year old. It is not intended to diagnose infection nor to guide or monitor treatment.     Anti-infectives    Start     Dose/Rate Route Frequency Ordered Stop   05/30/15 1000  rifampin (RIFADIN) capsule 300 mg     300 mg Oral Daily 05/29/15 1626     05/30/15 0000  rifampin (RIFADIN) 300 MG capsule     300 mg Oral Daily 05/30/15 0957     05/29/15 0906  tobramycin (NEBCIN) powder  Status:  Discontinued       As needed 05/29/15 0906 05/29/15 0942   05/29/15 0906  vancomycin (VANCOCIN) powder  Status:  Discontinued       As needed 05/29/15 0907 05/29/15 0942   05/28/15 1700  vancomycin (VANCOCIN) IVPB 1000 mg/200 mL premix  Status:  Discontinued     1,000 mg 200 mL/hr over 60 Minutes Intravenous Every 8 hours 05/28/15 0850 05/30/15 1221   05/28/15 1030  piperacillin-tazobactam (ZOSYN) IVPB 3.375 g  Status:   Discontinued     3.375 g 12.5 mL/hr over 240 Minutes Intravenous 3 times per day 05/28/15 1017 05/29/15 1626   05/28/15 1000  rifampin (RIFADIN) capsule 600 mg  Status:  Discontinued     600 mg Oral Daily 05/28/15 0805 05/29/15 1626   05/28/15 0930  vancomycin (VANCOCIN) 2,000 mg in sodium chloride 0.9 % 500 mL IVPB     2,000 mg 250 mL/hr over 120 Minutes Intravenous  Once 05/28/15 0850 05/28/15 1346   05/27/15 1754  vancomycin (VANCOCIN) powder  Status:  Discontinued       As needed 05/27/15 1754 05/27/15 1837   05/27/15 1753  tobramycin (NEBCIN) powder  Status:  Discontinued       As needed 05/27/15 1753 05/27/15 1837      Assessment: 19 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  He is on day #4 of antibiotics and his SCr has risen markedly from admission but is stable from yesterday.  His Vancomycin random level is 13 this morning.  His Vancomycin half-life is prolonged at ~16 hours.  Will adjust his regimen accordingly.  Goal of Therapy:  Vancomycin trough level 15-20 mcg/ml  Plan:  Vancomycin 1500mg  IV q24h BMET with AM labs Vancomycin trough at steady state  Estella Husk, Pharm.D., BCPS, AAHIVP Clinical Pharmacist Phone: [phone] or [phone] 05/31/2015, 10:59 AM

## 2015-05-31 NOTE — Progress Notes (Signed)
[facility]  Patient Status:   New pt for [facility] this admission  [facility] is providing the following services: HHRN, PT if ordered and home infusion pharmacy team for home IV ABX.  [facility] hospital coordinator provided in hospital teaching and reviewed POC for home IV Vancomycin with pt.  [facility] will be prepared to support weekend DC.  [facility] will need script for IV ABX at time of DC. Please obtain and fax to [phone].  Attention Pharmacy.  Thank you.  If patient discharges after hours, please call [phone].   Sedalia Muta 05/31/2015, 7:19 AM

## 2015-05-31 NOTE — Progress Notes (Signed)
Subjective: 2 Days Post-Op Procedure(s) (LRB): IRRIGATION AND DEBRIDEMENT POSSIBLE LAYERED CLOSURE OF PELVIS  (N/A) Patient reports pain as 2 on 0-10 scale.    Objective: Vital signs in last 24 hours: Temp:  [99.1 F (37.3 C)-101.1 F (38.4 C)] 99.1 F (37.3 C) (09/17 0600) Pulse Rate:  [98-118] 98 (09/17 0600) Resp:  [16-20] 20 (09/17 0600) BP: (131-148)/(70-90) 143/72 mmHg (09/17 0600) SpO2:  [95 %-98 %] 97 % (09/17 0600)  Intake/Output from previous day: 09/16 0701 - 09/17 0700 In: 1913 [P.O.:360; I.V.:1553] Out: 3125 [Urine:3125] Intake/Output this shift: Total I/O In: -  Out: 400 [Urine:400]   Recent Labs  05/29/15 0352 05/30/15 0530  HGB 10.2* 10.3*    Recent Labs  05/29/15 0352 05/30/15 0530  WBC 5.8 9.8  RBC 3.73* 3.84*  3.84*  HCT 31.2* 31.4*  PLT 267 288    Recent Labs  05/30/15 1102 05/31/15 0528  NA 137 136  K 4.0 4.2  CL 102 104  CO2 23 24  BUN 14 18  CREATININE 2.99* 2.95*  GLUCOSE 125* 110*  CALCIUM 9.4 9.1    Recent Labs  05/29/15 0352  INR 1.57*    ABD soft Neurovascular intact Sensation intact distally Intact pulses distally Incision: dressing C/D/I  Assessment/Plan: 2 Days Post-Op Procedure(s) (LRB): IRRIGATION AND DEBRIDEMENT POSSIBLE LAYERED CLOSURE OF PELVIS  (N/A) Principal Problem:   Osteomyelitis, pelvis Active Problems:   Prediabetes   Acute renal insufficiency   Iron deficiency   Vitamin D deficiency  Creatinine stable but not significantly improved.  BUN slightly worse.  Pain is stable.   Dressing is intact.  Patient requesting laxative to help move bowels.  Continue med management per pharmacy and ID.   SHEPPERSON,KIRSTIN J 05/31/2015, 8:55 AM

## 2015-06-01 LAB — ANAEROBIC CULTURE

## 2015-06-01 LAB — BASIC METABOLIC PANEL
ANION GAP: 8 (ref 5–15)
BUN: 16 mg/dL (ref 6–20)
CALCIUM: 9.4 mg/dL (ref 8.9–10.3)
CO2: 25 mmol/L (ref 22–32)
Chloride: 106 mmol/L (ref 101–111)
Creatinine, Ser: 2.5 mg/dL — ABNORMAL HIGH (ref 0.61–1.24)
GFR calc Af Amer: 42 mL/min — ABNORMAL LOW (ref >60)
GFR, EST NON AFRICAN AMERICAN: 36 mL/min — AB (ref >60)
GLUCOSE: 102 mg/dL — AB (ref 65–99)
Potassium: 4.1 mmol/L (ref 3.5–5.1)
SODIUM: 139 mmol/L (ref 135–145)

## 2015-06-01 LAB — GLUCOSE, CAPILLARY
GLUCOSE-CAPILLARY: 88 mg/dL (ref 65–99)
GLUCOSE-CAPILLARY: 138 mg/dL — AB (ref 65–99)
GLUCOSE-CAPILLARY: 94 mg/dL (ref 65–99)
Glucose-Capillary: 109 mg/dL — ABNORMAL HIGH (ref 65–99)

## 2015-06-01 LAB — VITAMIN D 1,25 DIHYDROXY: Vitamin D 1, 25 (OH)2 Total: <10 pg/mL

## 2015-06-01 NOTE — Progress Notes (Signed)
Subjective: 3 Days Post-Op Procedure(s) (LRB): IRRIGATION AND DEBRIDEMENT POSSIBLE LAYERED CLOSURE OF PELVIS  (N/A) Patient reports pain as 2 on 0-10 scale.   Patient feeling much better today.  Much less pain. Objective: Vital signs in last 24 hours: Temp:  [98 F (36.7 C)-99 F (37.2 C)] 98 F (36.7 C) (09/18 1517) Pulse Rate:  [90-107] 93 (09/18 1517) Resp:  [20] 20 (09/18 1517) BP: (137-140)/(82-85) 137/82 mmHg (09/18 1517) SpO2:  [100 %] 100 % (09/18 1517)  Intake/Output from previous day: 09/17 0701 - 09/18 0700 In: 5223.3 [P.O.:2040; I.V.:2683.3; IV Piggyback:500] Out: 3715 [Urine:3715] Intake/Output this shift: Total I/O In: 360 [P.O.:360] Out: 1500 [Urine:1500]   Recent Labs  05/30/15 0530  HGB 10.3*    Recent Labs  05/30/15 0530  WBC 9.8  RBC 3.84*  3.84*  HCT 31.4*  PLT 288    Recent Labs  05/31/15 0528 06/01/15 0723  NA 136 139  K 4.2 4.1  CL 104 106  CO2 24 25  BUN 18 16  CREATININE 2.95* 2.50*  GLUCOSE 110* 102*  CALCIUM 9.1 9.4   No results for input(s): LABPT, INR in the last 72 hours.  ABD soft Neurovascular intact Sensation intact distally Intact pulses distally  Assessment/Plan: 3 Days Post-Op Procedure(s) (LRB): IRRIGATION AND DEBRIDEMENT POSSIBLE LAYERED CLOSURE OF PELVIS  (N/A)  Principal Problem:   Osteomyelitis, pelvis Active Problems:   Prediabetes   Acute renal insufficiency   Iron deficiency   Vitamin D deficiency  Advance diet Up with therapy  He walked around the room with me today with his walker.  Now sitting on sofa.  Drank a Glucerna and encouraged to eat well balanced diet.    SHEPPERSON,KIRSTIN J 06/01/2015, 4:18 PM

## 2015-06-02 DIAGNOSIS — Z5181 Encounter for therapeutic drug level monitoring: Secondary | ICD-10-CM

## 2015-06-02 LAB — CREATININE, URINE, RANDOM: Creatinine, Urine: 41.09 mg/dL

## 2015-06-02 LAB — SODIUM, URINE, RANDOM: Sodium, Ur: 90 mmol/L

## 2015-06-02 LAB — CBC
HCT: 28.2 % — ABNORMAL LOW (ref 39.0–52.0)
HEMOGLOBIN: 9 g/dL — AB (ref 13.0–17.0)
MCH: 26.2 pg (ref 26.0–34.0)
MCHC: 31.9 g/dL (ref 30.0–36.0)
MCV: 82 fL (ref 78.0–100.0)
Platelets: 296 10*3/uL (ref 150–400)
RBC: 3.44 MIL/uL — AB (ref 4.22–5.81)
RDW: 12.7 % (ref 11.5–15.5)
WBC: 7.7 10*3/uL (ref 4.0–10.5)

## 2015-06-02 LAB — BASIC METABOLIC PANEL
ANION GAP: 9 (ref 5–15)
BUN: 15 mg/dL (ref 6–20)
CALCIUM: 9.1 mg/dL (ref 8.9–10.3)
CO2: 23 mmol/L (ref 22–32)
Chloride: 103 mmol/L (ref 101–111)
Creatinine, Ser: 2.37 mg/dL — ABNORMAL HIGH (ref 0.61–1.24)
GFR, EST AFRICAN AMERICAN: 44 mL/min — AB (ref >60)
GFR, EST NON AFRICAN AMERICAN: 38 mL/min — AB (ref >60)
Glucose, Bld: 94 mg/dL (ref 65–99)
Potassium: 3.7 mmol/L (ref 3.5–5.1)
SODIUM: 135 mmol/L (ref 135–145)

## 2015-06-02 LAB — GLUCOSE, CAPILLARY
GLUCOSE-CAPILLARY: 90 mg/dL (ref 65–99)
GLUCOSE-CAPILLARY: 90 mg/dL (ref 65–99)
GLUCOSE-CAPILLARY: 106 mg/dL — AB (ref 65–99)
GLUCOSE-CAPILLARY: 119 mg/dL — AB (ref 65–99)

## 2015-06-02 LAB — OSMOLALITY: OSMOLALITY: 288 mosm/kg (ref 275–300)

## 2015-06-02 MED ORDER — ENOXAPARIN SODIUM 60 MG/0.6ML ~~LOC~~ SOLN
60.0000 mg | SUBCUTANEOUS | Status: DC
  Administered 2015-06-03 – 2015-06-19 (×13): 60 mg via SUBCUTANEOUS
  Filled 2015-06-02 (×13): qty 0.6

## 2015-06-02 MED ORDER — ENOXAPARIN SODIUM 30 MG/0.3ML ~~LOC~~ SOLN: 30.0000 mg | SUBCUTANEOUS | Status: DC

## 2015-06-02 MED ORDER — DEXTROSE 5 % IV SOLN
2.0000 g | INTRAVENOUS | Status: DC
  Administered 2015-06-02 – 2015-06-09 (×8): 2 g via INTRAVENOUS
  Filled 2015-06-02 (×9): qty 2
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
## 2015-06-02 NOTE — Progress Notes (Signed)
INFECTIOUS DISEASE PROGRESS NOTE  ID: Mathew Norman is a 19 y.o. male with  Principal Problem:   Osteomyelitis, pelvis Active Problems:   Prediabetes   Acute renal insufficiency   Iron deficiency   Vitamin D deficiency  Subjective: Without complaints  Abtx:  Anti-infectives    Start     Dose/Rate Route Frequency Ordered Stop   05/31/15 1200  vancomycin (VANCOCIN) 1,500 mg in sodium chloride 0.9 % 500 mL IVPB     1,500 mg 250 mL/hr over 120 Minutes Intravenous Every 24 hours 05/31/15 1112     05/30/15 1000  rifampin (RIFADIN) capsule 300 mg     300 mg Oral Daily 05/29/15 1626     05/30/15 0000  rifampin (RIFADIN) 300 MG capsule     300 mg Oral Daily 05/30/15 0957     05/29/15 0906  tobramycin (NEBCIN) powder  Status:  Discontinued       As needed 05/29/15 0906 05/29/15 0942   05/29/15 0906  vancomycin (VANCOCIN) powder  Status:  Discontinued       As needed 05/29/15 0907 05/29/15 0942   05/28/15 1700  vancomycin (VANCOCIN) IVPB 1000 mg/200 mL premix  Status:  Discontinued     1,000 mg 200 mL/hr over 60 Minutes Intravenous Every 8 hours 05/28/15 0850 05/30/15 1221   05/28/15 1030  piperacillin-tazobactam (ZOSYN) IVPB 3.375 g  Status:  Discontinued     3.375 g 12.5 mL/hr over 240 Minutes Intravenous 3 times per day 05/28/15 1017 05/29/15 1626   05/28/15 1000  rifampin (RIFADIN) capsule 600 mg  Status:  Discontinued     600 mg Oral Daily 05/28/15 0805 05/29/15 1626   05/28/15 0930  vancomycin (VANCOCIN) 2,000 mg in sodium chloride 0.9 % 500 mL IVPB     2,000 mg 250 mL/hr over 120 Minutes Intravenous  Once 05/28/15 0850 05/28/15 1346   05/27/15 1754  vancomycin (VANCOCIN) powder  Status:  Discontinued       As needed 05/27/15 1754 05/27/15 1837   05/27/15 1753  tobramycin (NEBCIN) powder  Status:  Discontinued       As needed 05/27/15 1753 05/27/15 1837      Medications:  Scheduled: . cholecalciferol  1,000 Units Oral BID  . docusate sodium  100 mg Oral BID  . [START  ON 06/03/2015] enoxaparin (LOVENOX) injection  60 mg Subcutaneous Q24H  . insulin aspart  0-15 Units Subcutaneous TID WC  . polyethylene glycol  17 g Oral BID  . rifampin  300 mg Oral Daily  . vancomycin  1,500 mg Intravenous Q24H  . vitamin C  500 mg Oral BID  . Vitamin D (Ergocalciferol)  50,000 Units Oral Q7 days    Objective: Vital signs in last 24 hours: Temp:  [98.5 F (36.9 C)-98.9 F (37.2 C)] 98.5 F (36.9 C) (09/19 1450) Pulse Rate:  [84-97] 94 (09/19 1450) Resp:  [18-19] 18 (09/19 1450) BP: (136-147)/(75-94) 147/75 mmHg (09/19 1450) SpO2:  [100 %] 100 % (09/19 1450)   General appearance: alert, cooperative and no distress Incision/Wound: wound clean, vac in place.    Lab Results  Recent Labs  06/01/15 0723 06/02/15 0507  WBC  --  7.7  HGB  --  9.0*  HCT  --  28.2*  NA 139 135  K 4.1 3.7  CL 106 103  CO2 25 23  BUN 16 15  CREATININE 2.50* 2.37*   Liver Panel  Recent Labs  05/31/15 0528  PROT 6.3*  ALBUMIN 2.5*  AST 34  ALT 26  ALKPHOS 53  BILITOT 0.7  BILIDIR <0.1*  IBILI NOT CALCULATED   Sedimentation Rate No results for input(s): ESRSEDRATE in the last 72 hours. C-Reactive Protein No results for input(s): CRP in the last 72 hours.  Microbiology: Recent Results (from the past 240 hour(s))  Culture, blood (routine x 2)     Status: None   Collection Time: 05/26/15  6:35 AM  Result Value Ref Range Status   Specimen Description BLOOD LEFT ANTECUBITAL  Final   Special Requests BOTTLES DRAWN AEROBIC AND ANAEROBIC 5CC  Final   Culture NO GROWTH 5 DAYS  Final   Report Status 05/31/2015 FINAL  Final  Culture, blood (routine x 2)     Status: None   Collection Time: 05/26/15  6:42 AM  Result Value Ref Range Status   Specimen Description BLOOD RIGHT ANTECUBITAL  Final   Special Requests BOTTLES DRAWN AEROBIC AND ANAEROBIC 5CC  Final   Culture NO GROWTH 5 DAYS  Final   Report Status 05/31/2015 FINAL  Final  Urine culture     Status: None    Collection Time: 05/26/15  7:34 AM  Result Value Ref Range Status   Specimen Description URINE, CLEAN CATCH  Final   Special Requests NONE  Final   Culture MULTIPLE SPECIES PRESENT, SUGGEST RECOLLECTION  Final   Report Status 05/27/2015 FINAL  Final  Anaerobic culture     Status: None   Collection Time: 05/27/15  5:39 PM  Result Value Ref Range Status   Specimen Description WOUND PELVIS  Final   Special Requests NONE  Final   Gram Stain   Final    MODERATE WBC PRESENT, PREDOMINANTLY PMN NO SQUAMOUS EPITHELIAL CELLS SEEN MODERATE GRAM POSITIVE COCCI IN PAIRS Performed at [facility]    Culture   Final    MODERATE PEPTOSTREPTOCOCCUS SPECIES Performed at [facility]    Report Status 06/01/2015 FINAL  Final  Gram stain     Status: None   Collection Time: 05/27/15  5:39 PM  Result Value Ref Range Status   Specimen Description WOUND PELVIS  Final   Special Requests NONE  Final   Gram Stain   Final    FEW WBC PRESENT, PREDOMINANTLY MONONUCLEAR FEW GRAM POSITIVE COCCI IN PAIRS CRITICAL RESULT CALLED TO, READ BACK BY AND VERIFIED WITH: N IRISH  05/28/15 MKELLY    Report Status 05/28/2015 FINAL  Final  Wound culture     Status: None   Collection Time: 05/27/15  5:39 PM  Result Value Ref Range Status   Specimen Description WOUND PELVIS  Final   Special Requests NONE  Final   Gram Stain   Final    MODERATE WBC PRESENT, PREDOMINANTLY PMN NO SQUAMOUS EPITHELIAL CELLS SEEN MODERATE GRAM POSITIVE COCCI IN PAIRS Performed at [facility]    Culture   Final    NO GROWTH 2 DAYS Performed at [facility]    Report Status 05/30/2015 FINAL  Final  Culture, Urine     Status: None   Collection Time: 05/28/15  2:44 AM  Result Value Ref Range Status   Specimen Description URINE, CLEAN CATCH  Final   Special Requests NONE  Final   Culture 30,000 COLONIES/mL PROTEUS SPECIES  Final   Report Status 05/30/2015 FINAL  Final   Organism ID, Bacteria  PROTEUS SPECIES  Final      Susceptibility   Proteus species - MIC*    AMPICILLIN >=32 RESISTANT Resistant  CEFAZOLIN >=64 RESISTANT Resistant     CEFTRIAXONE <=1 SENSITIVE Sensitive     CIPROFLOXACIN <=0.25 SENSITIVE Sensitive     GENTAMICIN <=1 SENSITIVE Sensitive     IMIPENEM 4 SENSITIVE Sensitive     NITROFURANTOIN 128 RESISTANT Resistant     TRIMETH/SULFA <=20 SENSITIVE Sensitive     AMPICILLIN/SULBACTAM 16 INTERMEDIATE Intermediate     PIP/TAZO <=4 SENSITIVE Sensitive     * 30,000 COLONIES/mL PROTEUS SPECIES  Surgical pcr screen     Status: None   Collection Time: 05/29/15  6:11 AM  Result Value Ref Range Status   MRSA, PCR NEGATIVE NEGATIVE Final   Staphylococcus aureus NEGATIVE NEGATIVE Final    Comment:        The Xpert SA Assay (FDA approved for NASAL specimens in patients over 16 years of age), is one component of a comprehensive surveillance program.  Test performance has been validated by [facility] for patients greater than or equal to 59 year old. It is not intended to diagnose infection nor to guide or monitor treatment.     Studies/Results: No results found.   Assessment/Plan: Osteomyelitis of pelvis- peptostreptococcus ARF Fever Therapeutic drug monitoring  Total days of antibiotics: 6 vanco/rif  No further fevers Cr improving but still not at baseline.  Will continue his rifampin.  vanco has been resumed on 9-17.   cx showed peptostreptococcus- will change to ceftriaxone. His Cr has gone back down while on vanco.  Agree this could be contrast related.   Spoke with family, aunt. She would like him to get rehab for 2 weeks as he lives alone.          Johny Sax Infectious Diseases (pager) [phone] [email or web address] 06/02/2015, 4:55 PM  LOS: 7 days

## 2015-06-02 NOTE — Progress Notes (Signed)
Pt refused to get wound vacc dressing change; Last dressing changed date was 05/29/15; pt stated that he wanted to sleep and rest tonight and have it change during the day. Will report it to the day nurse and will continue to monitor pt.

## 2015-06-03 LAB — GLUCOSE, CAPILLARY
GLUCOSE-CAPILLARY: 88 mg/dL (ref 65–99)
GLUCOSE-CAPILLARY: 92 mg/dL (ref 65–99)
Glucose-Capillary: 96 mg/dL (ref 65–99)
Glucose-Capillary: 103 mg/dL — ABNORMAL HIGH (ref 65–99)

## 2015-06-03 LAB — RENAL FUNCTION PANEL
Albumin: 2.5 g/dL — ABNORMAL LOW (ref 3.5–5.0)
Anion gap: 9 (ref 5–15)
BUN: 12 mg/dL (ref 6–20)
CALCIUM: 9 mg/dL (ref 8.9–10.3)
CHLORIDE: 105 mmol/L (ref 101–111)
CO2: 22 mmol/L (ref 22–32)
CREATININE: 2.16 mg/dL — AB (ref 0.61–1.24)
GFR calc Af Amer: 50 mL/min — ABNORMAL LOW (ref >60)
GFR calc non Af Amer: 43 mL/min — ABNORMAL LOW (ref >60)
GLUCOSE: 102 mg/dL — AB (ref 65–99)
Phosphorus: 5.7 mg/dL — ABNORMAL HIGH (ref 2.5–4.6)
Potassium: 3.7 mmol/L (ref 3.5–5.1)
SODIUM: 136 mmol/L (ref 135–145)

## 2015-06-03 LAB — OSMOLALITY, URINE: OSMOLALITY UR: 252 mosm/kg — AB (ref 390–1090)

## 2015-06-03 MED ORDER — ADULT MULTIVITAMIN W/MINERALS CH
1.0000 | ORAL_TABLET | Freq: Every day | ORAL | Status: DC
  Administered 2015-06-03 – 2015-06-19 (×16): 1 via ORAL
  Filled 2015-06-03 (×16): qty 1

## 2015-06-03 MED ORDER — OXYCODONE-ACETAMINOPHEN 5-325 MG PO TABS
1.0000 | ORAL_TABLET | Freq: Four times a day (QID) | ORAL | Status: DC | PRN
  Administered 2015-06-03 – 2015-06-09 (×5): 2 via ORAL
  Filled 2015-06-03 (×5): qty 2
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
## 2015-06-03 NOTE — Progress Notes (Signed)
INFECTIOUS DISEASE PROGRESS NOTE  ID: Mathew Norman is a 19 y.o. male with  Principal Problem:   Osteomyelitis, pelvis Active Problems:   Prediabetes   Acute renal insufficiency   Iron deficiency   Vitamin D deficiency  Subjective: Without complaints.  Has ambulated with walker to couch.   Abtx:  Anti-infectives    Start     Dose/Rate Route Frequency Ordered Stop   06/02/15 1715  cefTRIAXone (ROCEPHIN) 2 g in dextrose 5 % 50 mL IVPB     2 g 100 mL/hr over 30 Minutes Intravenous Every 24 hours 06/02/15 1705     05/31/15 1200  vancomycin (VANCOCIN) 1,500 mg in sodium chloride 0.9 % 500 mL IVPB  Status:  Discontinued     1,500 mg 250 mL/hr over 120 Minutes Intravenous Every 24 hours 05/31/15 1112 06/02/15 1705   05/30/15 1000  rifampin (RIFADIN) capsule 300 mg     300 mg Oral Daily 05/29/15 1626     05/30/15 0000  rifampin (RIFADIN) 300 MG capsule     300 mg Oral Daily 05/30/15 0957     05/29/15 0906  tobramycin (NEBCIN) powder  Status:  Discontinued       As needed 05/29/15 0906 05/29/15 0942   05/29/15 0906  vancomycin (VANCOCIN) powder  Status:  Discontinued       As needed 05/29/15 0907 05/29/15 0942   05/28/15 1700  vancomycin (VANCOCIN) IVPB 1000 mg/200 mL premix  Status:  Discontinued     1,000 mg 200 mL/hr over 60 Minutes Intravenous Every 8 hours 05/28/15 0850 05/30/15 1221   05/28/15 1030  piperacillin-tazobactam (ZOSYN) IVPB 3.375 g  Status:  Discontinued     3.375 g 12.5 mL/hr over 240 Minutes Intravenous 3 times per day 05/28/15 1017 05/29/15 1626   05/28/15 1000  rifampin (RIFADIN) capsule 600 mg  Status:  Discontinued     600 mg Oral Daily 05/28/15 0805 05/29/15 1626   05/28/15 0930  vancomycin (VANCOCIN) 2,000 mg in sodium chloride 0.9 % 500 mL IVPB     2,000 mg 250 mL/hr over 120 Minutes Intravenous  Once 05/28/15 0850 05/28/15 1346   05/27/15 1754  vancomycin (VANCOCIN) powder  Status:  Discontinued       As needed 05/27/15 1754 05/27/15 1837   05/27/15 1753  tobramycin (NEBCIN) powder  Status:  Discontinued       As needed 05/27/15 1753 05/27/15 1837      Medications:  Scheduled: . cefTRIAXone (ROCEPHIN)  IV  2 g Intravenous Q24H  . cholecalciferol  1,000 Units Oral BID  . docusate sodium  100 mg Oral BID  . enoxaparin (LOVENOX) injection  60 mg Subcutaneous Q24H  . insulin aspart  0-15 Units Subcutaneous TID WC  . polyethylene glycol  17 g Oral BID  . rifampin  300 mg Oral Daily  . vitamin C  500 mg Oral BID  . Vitamin D (Ergocalciferol)  50,000 Units Oral Q7 days    Objective: Vital signs in last 24 hours: Temp:  [98.5 F (36.9 C)-99.6 F (37.6 C)] 98.7 F (37.1 C) (09/20 0615) Pulse Rate:  [78-94] 79 (09/20 0615) Resp:  [17-18] 17 (09/20 0615) BP: (139-147)/(75-82) 139/76 mmHg (09/20 0615) SpO2:  [99 %-100 %] 100 % (09/20 0615)   General appearance: alert, cooperative and no distress Incision/Wound: VAC in lower abd. Wound clean.   Lab Results  Recent Labs  06/02/15 0507 06/03/15 0430  WBC 7.7  --   HGB 9.0*  --  HCT 28.2*  --   NA 135 136  K 3.7 3.7  CL 103 105  CO2 23 22  BUN 15 12  CREATININE 2.37* 2.16*   Liver Panel  Recent Labs  06/03/15 0430  ALBUMIN 2.5*   Sedimentation Rate No results for input(s): ESRSEDRATE in the last 72 hours. C-Reactive Protein No results for input(s): CRP in the last 72 hours.  Microbiology: Recent Results (from the past 240 hour(s))  Culture, blood (routine x 2)     Status: None   Collection Time: 05/26/15  6:35 AM  Result Value Ref Range Status   Specimen Description BLOOD LEFT ANTECUBITAL  Final   Special Requests BOTTLES DRAWN AEROBIC AND ANAEROBIC 5CC  Final   Culture NO GROWTH 5 DAYS  Final   Report Status 05/31/2015 FINAL  Final  Culture, blood (routine x 2)     Status: None   Collection Time: 05/26/15  6:42 AM  Result Value Ref Range Status   Specimen Description BLOOD RIGHT ANTECUBITAL  Final   Special Requests BOTTLES DRAWN AEROBIC AND  ANAEROBIC 5CC  Final   Culture NO GROWTH 5 DAYS  Final   Report Status 05/31/2015 FINAL  Final  Urine culture     Status: None   Collection Time: 05/26/15  7:34 AM  Result Value Ref Range Status   Specimen Description URINE, CLEAN CATCH  Final   Special Requests NONE  Final   Culture MULTIPLE SPECIES PRESENT, SUGGEST RECOLLECTION  Final   Report Status 05/27/2015 FINAL  Final  Anaerobic culture     Status: None   Collection Time: 05/27/15  5:39 PM  Result Value Ref Range Status   Specimen Description WOUND PELVIS  Final   Special Requests NONE  Final   Gram Stain   Final    MODERATE WBC PRESENT, PREDOMINANTLY PMN NO SQUAMOUS EPITHELIAL CELLS SEEN MODERATE GRAM POSITIVE COCCI IN PAIRS Performed at [facility]    Culture   Final    MODERATE PEPTOSTREPTOCOCCUS SPECIES Performed at [facility]    Report Status 06/01/2015 FINAL  Final  Gram stain     Status: None   Collection Time: 05/27/15  5:39 PM  Result Value Ref Range Status   Specimen Description WOUND PELVIS  Final   Special Requests NONE  Final   Gram Stain   Final    FEW WBC PRESENT, PREDOMINANTLY MONONUCLEAR FEW GRAM POSITIVE COCCI IN PAIRS CRITICAL RESULT CALLED TO, READ BACK BY AND VERIFIED WITH: N IRISH @0116  05/28/15 MKELLY    Report Status 05/28/2015 FINAL  Final  Wound culture     Status: None   Collection Time: 05/27/15  5:39 PM  Result Value Ref Range Status   Specimen Description WOUND PELVIS  Final   Special Requests NONE  Final   Gram Stain   Final    MODERATE WBC PRESENT, PREDOMINANTLY PMN NO SQUAMOUS EPITHELIAL CELLS SEEN MODERATE GRAM POSITIVE COCCI IN PAIRS Performed at [facility]    Culture   Final    NO GROWTH 2 DAYS Performed at [facility]    Report Status 05/30/2015 FINAL  Final  Culture, Urine     Status: None   Collection Time: 05/28/15  2:44 AM  Result Value Ref Range Status   Specimen Description URINE, CLEAN CATCH  Final   Special Requests  NONE  Final   Culture 30,000 COLONIES/mL PROTEUS SPECIES  Final   Report Status 05/30/2015 FINAL  Final   Organism ID,  Bacteria PROTEUS SPECIES  Final      Susceptibility   Proteus species - MIC*    AMPICILLIN >=32 RESISTANT Resistant     CEFAZOLIN >=64 RESISTANT Resistant     CEFTRIAXONE <=1 SENSITIVE Sensitive     CIPROFLOXACIN <=0.25 SENSITIVE Sensitive     GENTAMICIN <=1 SENSITIVE Sensitive     IMIPENEM 4 SENSITIVE Sensitive     NITROFURANTOIN 128 RESISTANT Resistant     TRIMETH/SULFA <=20 SENSITIVE Sensitive     AMPICILLIN/SULBACTAM 16 INTERMEDIATE Intermediate     PIP/TAZO <=4 SENSITIVE Sensitive     * 30,000 COLONIES/mL PROTEUS SPECIES  Surgical pcr screen     Status: None   Collection Time: 05/29/15  6:11 AM  Result Value Ref Range Status   MRSA, PCR NEGATIVE NEGATIVE Final   Staphylococcus aureus NEGATIVE NEGATIVE Final    Comment:        The Xpert SA Assay (FDA approved for NASAL specimens in patients over 69 years of age), is one component of a comprehensive surveillance program.  Test performance has been validated by [facility] for patients greater than or equal to 31 year old. It is not intended to diagnose infection nor to guide or monitor treatment.     Studies/Results: No results found.   Assessment/Plan: Osteomyelitis of pelvis- peptostreptococcus ARF Fever Therapeutic drug monitoring  Total days of antibiotics: 6 vanco/rif --> ceftriaxone/rifampin  No problems with rifampin noted by pt.  Cr is improving.  Will plan for d/c on above anbx  Diagnosis: Osteomyelltis  Culture Result: peptostreptococcus  No Known Allergies  Discharge antibiotics: Ceftriaxone 2g ivpb q24h Rifampin 373m po qday.   Duration: 42 days End Date: July 09, 2015  [facility] Protocol: Labs weekly while on IV antibiotics: _X_ CBC with differential _X_ CMP _X_ CRP _X_ ESR  Fax weekly labs to [phone] Clinic Follow Up Appt: 6 weeks  HCreola CornInfectious Diseases (pager) [phone] 06/03/2015, 11:54 AM  LOS: 8 days
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
## 2015-06-03 NOTE — Anesthesia Postprocedure Evaluation (Signed)
  Anesthesia Post-op Note  Patient: Mathew Norman  Procedure(s) Performed: Procedure(s): IRRIGATION AND DEBRIDEMENT PELVIS (N/A) APPLICATION OF WOUND VAC with application of antibiotic beads (N/A)  Patient Location: PACU  Anesthesia Type:General  Level of Consciousness: awake  Airway and Oxygen Therapy: Patient Spontanous Breathing and Patient connected to nasal cannula oxygen  Post-op Pain: mild  Post-op Assessment: Post-op Vital signs reviewed, Patient's Cardiovascular Status Stable, Respiratory Function Stable, Patent Airway, No signs of Nausea or vomiting and Pain level controlled              Post-op Vital Signs: Reviewed and stable  Last Vitals:  Filed Vitals:   06/03/15 0615  BP: 139/76  Pulse: 79  Temp: 37.1 C  Resp: 17    Complications: No apparent anesthesia complications
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
## 2015-06-04 LAB — GLUCOSE, CAPILLARY
GLUCOSE-CAPILLARY: 90 mg/dL (ref 65–99)
GLUCOSE-CAPILLARY: 133 mg/dL — AB (ref 65–99)
GLUCOSE-CAPILLARY: 98 mg/dL (ref 65–99)
Glucose-Capillary: 104 mg/dL — ABNORMAL HIGH (ref 65–99)

## 2015-06-04 MED ORDER — ADULT MULTIVITAMIN W/MINERALS CH
1.0000 | ORAL_TABLET | Freq: Every day | ORAL | Status: DC
Start: 2015-06-04 — End: 2015-10-16

## 2015-06-04 MED ORDER — OXYCODONE-ACETAMINOPHEN 5-325 MG PO TABS: 1.0000 | ORAL_TABLET | Freq: Four times a day (QID) | ORAL | Status: DC | PRN

## 2015-06-04 MED ORDER — CEFTRIAXONE SODIUM 2 G IJ SOLR: 2.0000 g | INTRAMUSCULAR | Status: DC

## 2015-06-04 NOTE — Progress Notes (Signed)
[facility] Progress Note  Subjective  Doing well  No acute changes No new issues    Review of Systems  Constitutional: Negative for fever and chills.  Respiratory: Negative for shortness of breath and wheezing.   Cardiovascular: Negative for chest pain and palpitations.  Gastrointestinal: Negative for nausea and vomiting.  Genitourinary: Negative for dysuria.  Neurological: Negative for tingling, sensory change and headaches.     Objective   BP 148/87 mmHg  Pulse 82  Temp(Src) 99 F (37.2 C) (Oral)  Resp 18  Ht 5' 6"  (1.676 m)  Wt 117.028 kg (258 lb)  BMI 41.66 kg/m2  SpO2 100%  Intake/Output      09/20 0701 - 09/21 0700 09/21 0701 - 09/22 0700   P.O. 1140    I.V. (mL/kg) 1200 (10.3)    Total Intake(mL/kg) 2340 (20)    Urine (mL/kg/hr) 3475 (1.2) 600 (2.8)   Drains 25 (0)    Stool 0 (0)    Total Output 3500 600   Net -1160 -600        Stool Occurrence 1 x      Labs  Results for Mathew Norman, Mathew Norman (MRN [id number]) as of 06/04/2015 08:51  Ref. Range 06/03/2015 04:30  Sodium Latest Ref Range: 135-145 mmol/L 136  Potassium Latest Ref Range: 3.5-5.1 mmol/L 3.7  Chloride Latest Ref Range: 101-111 mmol/L 105  CO2 Latest Ref Range: 22-32 mmol/L 22  BUN Latest Ref Range: 6-20 mg/dL 12  Creatinine Latest Ref Range: 0.61-1.24 mg/dL 2.16 (H)  Calcium Latest Ref Range: 8.9-10.3 mg/dL 9.0  EGFR (Non-African Amer.) Latest Ref Range: >60 mL/min 43 (L)  EGFR (African American) Latest Ref Range: >60 mL/min 50 (L)  Glucose Latest Ref Range: 65-99 mg/dL 102 (H)  Anion gap Latest Ref Range: 5-15  9  Phosphorus Latest Ref Range: 2.5-4.6 mg/dL 5.7 (H)  Albumin Latest Ref Range: 3.5-5.0 g/dL 2.5 (L)    Exam  Gen: Resting comfortably in bed, no acute distress Cardiac: RRR, s1 and s2 Lungs: clear  Abd: Soft, nontender, nondistended Pelvis: Incisional wound VAC is stable, no real drainage in canister Ext:        Bilateral lower extremities  Extremities are warm             + Peripheral pulses             Motor and sensory functions are intact             No significant notable swelling    Assessment and Plan   POD/HD#: 56   19 y/o male with pelvic abscess and osteomyelitis 4 weeks s/p ORIF pelvic ring and L acetabulum   1. Pelvic abscess and osteomyelitis with retained hardware s/p I&D                         Bed to chair as tolerated                         NWB L Leg                         WBAT R leg             dc VAC before dc               patient on ceftriaxone and rifampin, PICC line in place  Appreciate ID consult                        2. Pain management:             Continue with current regimen             Add percocet 5/325 1-2 q6h prn pain   3. ABL anemia/Hemodynamics             Stable  4. Medical issues               Pre-diabetes                        dietary mods                Acute renal insufficiency                        continues to steadily improve               Iron Deficiency                           Pt with low iron and iron stores                         Given presence of active infection will hold on supplementation                         Recheck iron/ferritn/transferrin levels after infection cleared   5. DVT/PE prophylaxis:            lovenox   6. ID:               See #1             ceftriaxone and rifampin               Pelvic abscess cultures finally show some peptostreptococcus    7. FEN/Foley/Lines:             CHO mod diet                 8. Dispo:                         Continue with current management             Possible dc home tomorrow      Jari Pigg, PA-C [facility] [phone] (O) 06/04/2015 8:50 AM

## 2015-06-05 ENCOUNTER — Encounter (HOSPITAL_COMMUNITY)
Admission: EM | Disposition: A | Discharge status: Home / Self Care | Payer: Self-pay | Source: Home / Self Care | Attending: Orthopedic Surgery

## 2015-06-05 ENCOUNTER — Encounter (HOSPITAL_COMMUNITY): Payer: Self-pay | Admitting: Anesthesiology

## 2015-06-05 LAB — GLUCOSE, CAPILLARY
GLUCOSE-CAPILLARY: 98 mg/dL (ref 65–99)
GLUCOSE-CAPILLARY: 113 mg/dL — AB (ref 65–99)
Glucose-Capillary: 107 mg/dL — ABNORMAL HIGH (ref 65–99)
Glucose-Capillary: 103 mg/dL — ABNORMAL HIGH (ref 65–99)

## 2015-06-05 LAB — RENAL FUNCTION PANEL
Albumin: 2.7 g/dL — ABNORMAL LOW (ref 3.5–5.0)
Anion gap: 8 (ref 5–15)
BUN: 9 mg/dL (ref 6–20)
CALCIUM: 9.4 mg/dL (ref 8.9–10.3)
CO2: 23 mmol/L (ref 22–32)
CREATININE: 1.85 mg/dL — AB (ref 0.61–1.24)
Chloride: 105 mmol/L (ref 101–111)
GFR calc non Af Amer: 52 mL/min — ABNORMAL LOW (ref >60)
GFR, EST AFRICAN AMERICAN: 60 mL/min — AB (ref >60)
GLUCOSE: 95 mg/dL (ref 65–99)
Phosphorus: 5.2 mg/dL — ABNORMAL HIGH (ref 2.5–4.6)
Potassium: 3.8 mmol/L (ref 3.5–5.1)
SODIUM: 136 mmol/L (ref 135–145)

## 2015-06-05 LAB — TESTOSTERONE, % FREE: Testosterone-% Free: 1.5 % — ABNORMAL HIGH (ref 0.2–0.7)

## 2015-06-05 SURGERY — MINOR IRRIGATION AND DEBRIDEMENT OF WOUND
Anesthesia: General

## 2015-06-05 MED ORDER — LACTATED RINGERS IV SOLN
INTRAVENOUS | Status: DC
  Administered 2015-06-05 (×2): via INTRAVENOUS

## 2015-06-05 MED ORDER — PROPOFOL 10 MG/ML IV BOLUS
INTRAVENOUS | Status: AC
  Filled 2015-06-05: qty 20

## 2015-06-05 MED ORDER — FENTANYL CITRATE (PF) 250 MCG/5ML IJ SOLN
INTRAMUSCULAR | Status: AC
  Filled 2015-06-05: qty 5

## 2015-06-05 MED ORDER — MIDAZOLAM HCL 2 MG/2ML IJ SOLN
INTRAMUSCULAR | Status: AC
  Filled 2015-06-05: qty 4

## 2015-06-05 NOTE — Anesthesia Preprocedure Evaluation (Addendum)
Anesthesia Evaluation  Patient identified by MRN, date of birth, ID band Patient awake    Reviewed: Allergy & Precautions, NPO status , Patient's Chart, lab work & pertinent test results  History of Anesthesia Complications Negative for: history of anesthetic complications  Airway Mallampati: I  TM Distance: >3 FB Neck ROM: Full    Dental  (+) Dental Advisory Given, Teeth Intact   Pulmonary neg pulmonary ROS,    breath sounds clear to auscultation       Cardiovascular negative cardio ROS   Rhythm:Regular Rate:Normal     Neuro/Psych negative neurological ROS  negative psych ROS   GI/Hepatic negative GI ROS, Neg liver ROS,   Endo/Other  Morbid obesity  Renal/GU negative Renal ROS     Musculoskeletal   Abdominal   Peds  Hematology  (+) anemia ,   Anesthesia Other Findings   Reproductive/Obstetrics                          Anesthesia Physical  Anesthesia Plan  ASA: II  Anesthesia Plan: General   Post-op Pain Management:    Induction: Intravenous  Airway Management Planned: Oral ETT  Additional Equipment: None  Intra-op Plan:   Post-operative Plan: Extubation in OR  Informed Consent: I have reviewed the patients History and Physical, chart, labs and discussed the procedure including the risks, benefits and alternatives for the proposed anesthesia with the patient or authorized representative who has indicated his/her understanding and acceptance.   Dental advisory given  Plan Discussed with: CRNA, Anesthesiologist and Surgeon  Anesthesia Plan Comments:        Anesthesia Quick Evaluation

## 2015-06-05 NOTE — Progress Notes (Signed)
Physical Therapy Cancellation Note    06/05/15 1300  PT Visit Information  Last PT Received On 06/05/15  Reason Eval/Treat Not Completed Patient at procedure or test/unavailable   Patient has been taken to OR for additional procedure  Charlsie Merles, PT [phone]

## 2015-06-05 NOTE — Progress Notes (Signed)
Report of removal of 3 stitched and pressure applied to express some pussy drainage by Dr. Carola Frost called to Burnetta Sabin, RN on 6 [place] . Pt transported back to OR.

## 2015-06-05 NOTE — Progress Notes (Signed)
Dr. Carola Frost in to removed 3 stitches  from incision and applied pressure to express puss from wound.  Pt tolerated well. Inciision redressed with 4x4's and hypafix.

## 2015-06-05 NOTE — Progress Notes (Signed)
[facility] Progress Note  Subjective  Feels well No new issues   Review of Systems  Constitutional: Negative for fever and chills.  Respiratory: Negative for shortness of breath and wheezing.   Cardiovascular: Negative for chest pain and palpitations.  Gastrointestinal: Negative for nausea, vomiting and abdominal pain.  Neurological: Negative for tingling and sensory change.     Objective   BP 147/84 mmHg  Pulse 83  Temp(Src) 98.7 F (37.1 C) (Oral)  Resp 18  Ht 5' 6"  (1.676 m)  Wt 117.028 kg (258 lb)  BMI 41.66 kg/m2  SpO2 100%  Intake/Output      09/21 0701 - 09/22 0700 09/22 0701 - 09/23 0700   P.O. 720    I.V. (mL/kg)     Total Intake(mL/kg) 720 (6.2)    Urine (mL/kg/hr) 2675 (1) 350 (0.7)   Drains 50 (0) 5 (0)   Stool 0 (0)    Total Output 2725 355   Net -2005 -355        Stool Occurrence 1 x      Labs  Results for CHANEY, MACLAREN (MRN [id number]) as of 06/05/2015 11:14  Ref. Range 06/05/2015 08:30  Sodium Latest Ref Range: 135-145 mmol/L 136  Potassium Latest Ref Range: 3.5-5.1 mmol/L 3.8  Chloride Latest Ref Range: 101-111 mmol/L 105  CO2 Latest Ref Range: 22-32 mmol/L 23  BUN Latest Ref Range: 6-20 mg/dL 9  Creatinine Latest Ref Range: 0.61-1.24 mg/dL 1.85 (H)  Calcium Latest Ref Range: 8.9-10.3 mg/dL 9.4  EGFR (Non-African Amer.) Latest Ref Range: >60 mL/min 52 (L)  EGFR (African American) Latest Ref Range: >60 mL/min 60 (L)  Glucose Latest Ref Range: 65-99 mg/dL 95  Anion gap Latest Ref Range: 5-15  8  Phosphorus Latest Ref Range: 2.5-4.6 mg/dL 5.2 (H)  Albumin Latest Ref Range: 3.5-5.0 g/dL 2.7 (L)    Exam    Gen: Resting comfortably in bed, no acute distress Cardiac: RRR, s1 and s2 Lungs: clear   Abd: Soft, nontender, nondistended Pelvis: purulence noted from incision, also able to express purulence as well. incision line itself looks good, no erythema, some odor noted  Ext:        Bilateral lower extremities    Extremities are warm             + Peripheral pulses             Motor and sensory functions are intact             No significant notable swelling    Assessment and Plan   POD/HD#: 77   19 y/o male with pelvic abscess and osteomyelitis 4 weeks s/p ORIF pelvic ring and L acetabulum   1. Pelvic abscess and osteomyelitis with retained hardware s/p I&D              Return to OR for another wash out of pelvis   May need to vac wound to closure, secondary intention healing               Bed to chair as tolerated                         NWB L Leg                         WBAT R leg             dc VAC before dc  patient on ceftriaxone and rifampin, PICC line in place                           Appreciate ID consult                        2. Pain management:             Continue with current regimen             Add percocet 5/325 1-2 q6h prn pain   3. ABL anemia/Hemodynamics             Stable  4. Medical issues               Pre-diabetes                        dietary mods                Acute renal insufficiency                        continues to steadily improve               Iron Deficiency                           Pt with low iron and iron stores                         Given presence of active infection will hold on supplementation                         Recheck iron/ferritn/transferrin levels after infection cleared   5. DVT/PE prophylaxis:            lovenox   6. ID:               See #1             ceftriaxone and rifampin               Pelvic abscess cultures finally show some peptostreptococcus    7. FEN/Foley/Lines:             npo now                8. Dispo:                         OR today for repeat I&D   Jari Pigg, PA-C [facility] [phone] (O) 06/05/2015 11:13 AM

## 2015-06-06 ENCOUNTER — Inpatient Hospital Stay (HOSPITAL_COMMUNITY): Payer: Commercial Managed Care - HMO | Admitting: Anesthesiology

## 2015-06-06 ENCOUNTER — Encounter (HOSPITAL_COMMUNITY): Payer: Self-pay | Admitting: Certified Registered Nurse Anesthetist

## 2015-06-06 ENCOUNTER — Encounter (HOSPITAL_COMMUNITY)
Admission: EM | Disposition: A | Discharge status: Home / Self Care | Payer: Self-pay | Source: Home / Self Care | Attending: Orthopedic Surgery

## 2015-06-06 HISTORY — PX: INCISION AND DRAINAGE OF WOUND: SHX1803

## 2015-06-06 LAB — GLUCOSE, CAPILLARY
GLUCOSE-CAPILLARY: 96 mg/dL (ref 65–99)
GLUCOSE-CAPILLARY: 82 mg/dL (ref 65–99)
Glucose-Capillary: 127 mg/dL — ABNORMAL HIGH (ref 65–99)
Glucose-Capillary: 134 mg/dL — ABNORMAL HIGH (ref 65–99)

## 2015-06-06 LAB — RENAL FUNCTION PANEL
ALBUMIN: 2.8 g/dL — AB (ref 3.5–5.0)
Anion gap: 8 (ref 5–15)
BUN: 10 mg/dL (ref 6–20)
CALCIUM: 9.4 mg/dL (ref 8.9–10.3)
CO2: 25 mmol/L (ref 22–32)
CREATININE: 1.77 mg/dL — AB (ref 0.61–1.24)
Chloride: 104 mmol/L (ref 101–111)
GFR calc Af Amer: >60 mL/min (ref >60)
GFR calc non Af Amer: 55 mL/min — ABNORMAL LOW (ref >60)
GLUCOSE: 97 mg/dL (ref 65–99)
PHOSPHORUS: 6.3 mg/dL — AB (ref 2.5–4.6)
Potassium: 3.8 mmol/L (ref 3.5–5.1)
SODIUM: 137 mmol/L (ref 135–145)

## 2015-06-06 LAB — GRAM STAIN

## 2015-06-06 SURGERY — IRRIGATION AND DEBRIDEMENT WOUND
Anesthesia: General | Site: Abdomen

## 2015-06-06 MED ORDER — NEOSTIGMINE METHYLSULFATE 10 MG/10ML IV SOLN
INTRAVENOUS | Status: DC | PRN
  Administered 2015-06-06: 5 mg via INTRAVENOUS

## 2015-06-06 MED ORDER — OXYCODONE HCL 5 MG/5ML PO SOLN: 5.0000 mg | Freq: Once | ORAL | Status: DC | PRN

## 2015-06-06 MED ORDER — ROCURONIUM BROMIDE 100 MG/10ML IV SOLN
INTRAVENOUS | Status: DC | PRN
  Administered 2015-06-06: 10 mg via INTRAVENOUS
  Administered 2015-06-06: 25 mg via INTRAVENOUS
  Administered 2015-06-06: 15 mg via INTRAVENOUS

## 2015-06-06 MED ORDER — PROPOFOL 10 MG/ML IV BOLUS
INTRAVENOUS | Status: DC | PRN
  Administered 2015-06-06: 200 mg via INTRAVENOUS

## 2015-06-06 MED ORDER — CEFTRIAXONE SODIUM 2 G IJ SOLR
2.0000 g | INTRAMUSCULAR | Status: DC | PRN
  Administered 2015-06-06: 2 g via INTRAVENOUS

## 2015-06-06 MED ORDER — LACTATED RINGERS IV SOLN
INTRAVENOUS | Status: DC | PRN
  Administered 2015-06-06 (×2): via INTRAVENOUS

## 2015-06-06 MED ORDER — PROPOFOL 10 MG/ML IV BOLUS
INTRAVENOUS | Status: AC
  Filled 2015-06-06: qty 20

## 2015-06-06 MED ORDER — FENTANYL CITRATE (PF) 100 MCG/2ML IJ SOLN
INTRAMUSCULAR | Status: AC
  Filled 2015-06-06: qty 2

## 2015-06-06 MED ORDER — PROMETHAZINE HCL 25 MG/ML IJ SOLN: 6.2500 mg | INTRAMUSCULAR | Status: DC | PRN

## 2015-06-06 MED ORDER — FENTANYL CITRATE (PF) 100 MCG/2ML IJ SOLN
INTRAMUSCULAR | Status: DC | PRN
  Administered 2015-06-06: 100 ug via INTRAVENOUS
  Administered 2015-06-06: 50 ug via INTRAVENOUS
  Administered 2015-06-06: 100 ug via INTRAVENOUS
  Administered 2015-06-06 (×2): 50 ug via INTRAVENOUS

## 2015-06-06 MED ORDER — MIDAZOLAM HCL 2 MG/2ML IJ SOLN
INTRAMUSCULAR | Status: AC
  Filled 2015-06-06: qty 4

## 2015-06-06 MED ORDER — GLYCOPYRROLATE 0.2 MG/ML IJ SOLN
INTRAMUSCULAR | Status: DC | PRN
  Administered 2015-06-06: 0.6 mg via INTRAVENOUS

## 2015-06-06 MED ORDER — FENTANYL CITRATE (PF) 250 MCG/5ML IJ SOLN
INTRAMUSCULAR | Status: AC
  Filled 2015-06-06: qty 5

## 2015-06-06 MED ORDER — FENTANYL CITRATE (PF) 100 MCG/2ML IJ SOLN
25.0000 ug | INTRAMUSCULAR | Status: DC | PRN
  Administered 2015-06-06 (×2): 50 ug via INTRAVENOUS

## 2015-06-06 MED ORDER — OXYCODONE HCL 5 MG PO TABS
ORAL_TABLET | ORAL | Status: AC
  Filled 2015-06-06: qty 1

## 2015-06-06 MED ORDER — SUCCINYLCHOLINE CHLORIDE 20 MG/ML IJ SOLN
INTRAMUSCULAR | Status: DC | PRN
  Administered 2015-06-06: 100 mg via INTRAVENOUS

## 2015-06-06 MED ORDER — LIDOCAINE HCL (CARDIAC) 20 MG/ML IV SOLN
INTRAVENOUS | Status: DC | PRN
  Administered 2015-06-06: 100 mg via INTRAVENOUS

## 2015-06-06 MED ORDER — SODIUM CHLORIDE 0.9 % IR SOLN
Status: DC | PRN
  Administered 2015-06-06: 6000 mL

## 2015-06-06 MED ORDER — OXYCODONE HCL 5 MG PO TABS: 5.0000 mg | ORAL_TABLET | Freq: Once | ORAL | Status: DC | PRN

## 2015-06-06 SURGICAL SUPPLY — 54 items
BRUSH FEMORAL CANAL (MISCELLANEOUS) IMPLANT
BRUSH SCRUB DISP (MISCELLANEOUS) ×6 IMPLANT
COVER SURGICAL LIGHT HANDLE (MISCELLANEOUS) ×6 IMPLANT
DRAPE C-ARMOR (DRAPES) ×3 IMPLANT
DRAPE IMP U-DRAPE 54X76 (DRAPES) ×3 IMPLANT
DRAPE INCISE IOBAN 66X45 STRL (DRAPES) IMPLANT
DRAPE ORTHO SPLIT 77X108 STRL (DRAPES) ×6
DRAPE SURG ORHT 6 SPLT 77X108 (DRAPES) ×2 IMPLANT
DRAPE U-SHAPE 47X51 STRL (DRAPES) ×3 IMPLANT
DRSG PAD ABDOMINAL 8X10 ST (GAUZE/BANDAGES/DRESSINGS) ×3 IMPLANT
DRSG VAC ATS MED SENSATRAC (GAUZE/BANDAGES/DRESSINGS) ×2 IMPLANT
DRSG VAC ATS SM SENSATRAC (GAUZE/BANDAGES/DRESSINGS) ×2 IMPLANT
DRSG VERSA FOAM LRG 10X15 (GAUZE/BANDAGES/DRESSINGS) ×2 IMPLANT
ELECT BLADE 6.5 EXT (BLADE) ×3 IMPLANT
ELECT CAUTERY BLADE 6.4 (BLADE) ×3 IMPLANT
ELECT REM PT RETURN 9FT ADLT (ELECTROSURGICAL) ×3
ELECTRODE REM PT RTRN 9FT ADLT (ELECTROSURGICAL) ×1 IMPLANT
EVACUATOR 1/8 PVC DRAIN (DRAIN) IMPLANT
GAUZE SPONGE 4X4 12PLY STRL (GAUZE/BANDAGES/DRESSINGS) ×3 IMPLANT
GAUZE XEROFORM 5X9 LF (GAUZE/BANDAGES/DRESSINGS) ×3 IMPLANT
GLOVE BIO SURGEON STRL SZ7.5 (GLOVE) ×3 IMPLANT
GLOVE BIO SURGEON STRL SZ8 (GLOVE) ×3 IMPLANT
GLOVE BIOGEL PI IND STRL 7.5 (GLOVE) ×1 IMPLANT
GLOVE BIOGEL PI IND STRL 8 (GLOVE) ×1 IMPLANT
GLOVE BIOGEL PI INDICATOR 7.5 (GLOVE) ×2
GLOVE BIOGEL PI INDICATOR 8 (GLOVE) ×2
GOWN STRL REUS W/ TWL XL LVL3 (GOWN DISPOSABLE) ×1 IMPLANT
GOWN STRL REUS W/TWL 2XL LVL3 (GOWN DISPOSABLE) ×9 IMPLANT
GOWN STRL REUS W/TWL XL LVL3 (GOWN DISPOSABLE) ×3
HANDPIECE INTERPULSE COAX TIP (DISPOSABLE)
KIT ROOM TURNOVER OR (KITS) ×3 IMPLANT
MANIFOLD NEPTUNE II (INSTRUMENTS) ×3 IMPLANT
NEEDLE 22X1 1/2 (OR ONLY) (NEEDLE) IMPLANT
NS IRRIG 1000ML POUR BTL (IV SOLUTION) ×3 IMPLANT
PACK TOTAL JOINT (CUSTOM PROCEDURE TRAY) ×3 IMPLANT
PACK UNIVERSAL I (CUSTOM PROCEDURE TRAY) ×3 IMPLANT
PAD ARMBOARD 7.5X6 YLW CONV (MISCELLANEOUS) ×6 IMPLANT
PAD NEG PRESSURE SENSATRAC (MISCELLANEOUS) ×2 IMPLANT
PILLOW ABDUCTION HIP (SOFTGOODS) IMPLANT
SET HNDPC FAN SPRY TIP SCT (DISPOSABLE) IMPLANT
SPONGE LAP 18X18 X RAY DECT (DISPOSABLE) IMPLANT
STAPLER VISISTAT 35W (STAPLE) ×3 IMPLANT
SUT VIC AB 0 CT1 27 (SUTURE) ×6
SUT VIC AB 0 CT1 27XBRD ANBCTR (SUTURE) ×2 IMPLANT
SUT VIC AB 1 CT1 27 (SUTURE) ×3
SUT VIC AB 1 CT1 27XBRD ANBCTR (SUTURE) ×1 IMPLANT
SUT VIC AB 2-0 CT1 27 (SUTURE) ×3
SUT VIC AB 2-0 CT1 TAPERPNT 27 (SUTURE) ×1 IMPLANT
SYR CONTROL 10ML LL (SYRINGE) ×3 IMPLANT
TOWEL OR 17X24 6PK STRL BLUE (TOWEL DISPOSABLE) ×3 IMPLANT
TOWEL OR 17X26 10 PK STRL BLUE (TOWEL DISPOSABLE) ×6 IMPLANT
TOWER CARTRIDGE SMART MIX (DISPOSABLE) IMPLANT
TRAY FOLEY CATH 16FRSI W/METER (SET/KITS/TRAYS/PACK) IMPLANT
WATER STERILE IRR 1000ML POUR (IV SOLUTION) ×12 IMPLANT

## 2015-06-06 NOTE — Progress Notes (Signed)
PT Cancellation Note  Patient Details Name: MYRLE DUES MRN: [id number] DOB: 02-20-1996   Cancelled Treatment:     Pt was unavailable for PT today secondary to surgery. Will attempt again at a later date.   Greggory Stallion 06/06/2015, 1:16 PM

## 2015-06-06 NOTE — Anesthesia Postprocedure Evaluation (Signed)
  Anesthesia Post-op Note  Patient: Mathew Norman  Procedure(s) Performed: Procedure(s): IRRIGATION AND DEBRIDEMENT WOUND (N/A)  Patient Location: PACU  Anesthesia Type:General and GA combined with regional for post-op pain  Level of Consciousness: awake, alert  and oriented  Airway and Oxygen Therapy: Patient Spontanous Breathing and Patient connected to nasal cannula oxygen  Post-op Pain: mild  Post-op Assessment: Post-op Vital signs reviewed, Patient's Cardiovascular Status Stable, Respiratory Function Stable, Patent Airway and Pain level controlled LLE Motor Response: Purposeful movement, Responds to commands LLE Sensation: No numbness          Post-op Vital Signs: stable  Last Vitals:  Filed Vitals:   06/06/15 1319  BP: 152/78  Pulse: 62  Temp: 36.9 C  Resp: 14    Complications: No apparent anesthesia complications

## 2015-06-06 NOTE — Transfer of Care (Signed)
Immediate Anesthesia Transfer of Care Note  Patient: Mathew Norman  Procedure(s) Performed: Procedure(s): IRRIGATION AND DEBRIDEMENT WOUND (N/A)  Patient Location: PACU  Anesthesia Type:General  Level of Consciousness: awake, alert , oriented, patient cooperative and responds to stimulation  Airway & Oxygen Therapy: Patient Spontanous Breathing and Patient connected to nasal cannula oxygen  Post-op Assessment: Report given to RN, Post -op Vital signs reviewed and stable, Patient moving all extremities X 4 and Patient able to stick tongue midline  Post vital signs: Reviewed and stable  Last Vitals:  Filed Vitals:   06/06/15 0539  BP: 140/81  Pulse: 82  Temp: 37 C  Resp: 16    Complications: No apparent anesthesia complications

## 2015-06-06 NOTE — Brief Op Note (Signed)
05/26/2015 - 06/06/2015  9:12 AM  PATIENT:  Mathew Norman  19 y.o. male  PRE-OPERATIVE DIAGNOSIS:  Recurrent post-operative pelvic abscess  POST-OPERATIVE DIAGNOSIS:  Recurrent post-operative pelvic abscess  PROCEDURE:  Procedure(s): 1. INCISION AND DRAINAGE (N/A) 2. APPLICATION OF MEDIUM WOUND VAC  SURGEON:  Surgeon(s) and Role:    * Myrene Galas, MD - Primary  PHYSICIAN ASSISTANT: None  ANESTHESIA:   general  I/O:     SPECIMEN:  Source of Specimen:  ABSCESS  DISPOSITION OF SPECIMEN:  To micro  TOURNIQUET:  * No tourniquets in log *  DICTATION: .Other Dictation: Dictation Number [id number]

## 2015-06-06 NOTE — Progress Notes (Signed)
No changes overnight.  I discussed with the patient the risks and benefits of surgery, including the possibility of infection, nerve injury, vessel injury, wound breakdown, arthritis, symptomatic hardware, DVT/ PE, loss of motion, and need for further surgery among others.  We also specifically discussed the elevated risk of persistent infection.  He understood these risks and wished to proceed.  Myrene Galas, MD [facility] [phone] (p)

## 2015-06-06 NOTE — Anesthesia Preprocedure Evaluation (Signed)
Anesthesia Evaluation  Patient identified by MRN, date of birth, ID band Patient awake    Reviewed: Allergy & Precautions, NPO status , Patient's Chart, lab work & pertinent test results  History of Anesthesia Complications Negative for: history of anesthetic complications  Airway Mallampati: I  TM Distance: >3 FB Neck ROM: Full    Dental  (+) Dental Advisory Given, Teeth Intact   Pulmonary neg pulmonary ROS,    breath sounds clear to auscultation       Cardiovascular negative cardio ROS   Rhythm:Regular     Neuro/Psych negative neurological ROS  negative psych ROS   GI/Hepatic negative GI ROS, Neg liver ROS,   Endo/Other  Morbid obesity  Renal/GU negative Renal ROS     Musculoskeletal   Abdominal   Peds  Hematology  (+) anemia ,   Anesthesia Other Findings   Reproductive/Obstetrics                             Anesthesia Physical  Anesthesia Plan  ASA: III  Anesthesia Plan: General   Post-op Pain Management:    Induction: Intravenous  Airway Management Planned: Oral ETT  Additional Equipment: None  Intra-op Plan:   Post-operative Plan: Extubation in OR  Informed Consent: I have reviewed the patients History and Physical, chart, labs and discussed the procedure including the risks, benefits and alternatives for the proposed anesthesia with the patient or authorized representative who has indicated his/her understanding and acceptance.   Dental advisory given  Plan Discussed with: CRNA, Anesthesiologist and Surgeon  Anesthesia Plan Comments:         Anesthesia Quick Evaluation

## 2015-06-06 NOTE — Op Note (Signed)
NAME:  [name], [name] NO.:  [id number]  MEDICAL RECORD NO.:  [id number]  LOCATION:  [facility]                         FACILITY:  [facility]  PHYSICIAN:  Doralee Albino. Carola Frost, M.D. DATE OF BIRTH:  February 28, 1996  DATE OF PROCEDURE:  06/06/2015 DATE OF DISCHARGE:                              OPERATIVE REPORT   PREOPERATIVE DIAGNOSIS:  Recurrent postoperative pelvic abscess.  POSTOPERATIVE DIAGNOSIS:  Recurrent postoperative pelvic abscess.  PROCEDURE: 1. Incision and drainage of pelvic abscess with debridement of     subcutaneous tissue, muscle, fascia. 2. Application of medium wound VAC.  SURGEON:  Doralee Albino. Carola Frost, M.D.  ASSISTANT:  None.  ANESTHESIA:  General.  SPECIMENS:  Two anaerobic, aerobic cultures sent to Micro.  DISPOSITION:  To PACU.  CONDITION:  Stable.  BRIEF SUMMARY OF INDICATIONS FOR PROCEDURE:  Mathew Norman is an 31- year-old male with prediabetes and renal insufficiency who was well being prepared for discharge yesterday after improvement in his creatinine level when evaluation of his abdominal wound despite the outside pristine appearance was noted to have some substantial purulent drainage.  As the patient had eaten, we removed the sutures allowing for egress and expressing as much as possible, and then schedule him for surgery first thing this morning.  I did discuss with him the risks and benefits, including failure to eradicate the infection, the need for further surgery, DVT, PE, and multiple others including the clearly apparent need for further procedures to obtain closure and then eventual removal of the hardware.  The patient acknowledged these risks and strongly wished to proceed.  BRIEF SUMMARY OF PROCEDURE:  The patient was taken to the operating room where general anesthesia was induced.  The remaining sutures were removed from his abdominal wound.  Over 100 mL were evacuated from the abscess.  Prior to doing so, I did obtain  anaerobic and aerobic cultures, which were sent for micro.  I used a curette to remove the fibrinous material overlying the abscess cavity as well as chlorhexidine on a sponge scrubbing the cavity thoroughly and then using 6000 mL of saline.  I did sharply excise with a knife subcutaneous tissue and some devitalized fascia as well.  A wound VAC was then placed using white foam over the plate and then standard black foam for the remainder of the cavity.  The patient was then awakened from anesthesia and transferred to the PACU in stable condition.  PROGNOSIS:  Mr. Blankenbaker has a large cavity within the pelvis and obtaining closure will be a formidable task and likely require multiple surgeries.  Now that he has had repeat evacuation and we are nearly 6 weeks from his index surgery at this time, it may be most prudent to go ahead and remove all the hardware, which would obviate the need for serial procedures down the line.  I would anticipate him to be nonweightbearing for the next 2 weeks at any rate, and this would enable his fracture to complete union prior to seeing any stress component.  I will discuss this further with him, but would plan for that beginning Monday.     Doralee Albino. Carola Frost, M.D.     [facility]/MEDQ  D:  06/06/2015  T:  06/06/2015  Job:  [id number]

## 2015-06-06 NOTE — Anesthesia Procedure Notes (Signed)
Procedure Name: Intubation Date/Time: 06/06/2015 8:07 AM Performed by: Marylyn Ishihara Pre-anesthesia Checklist: Patient identified, Emergency Drugs available, Suction available, Patient being monitored and Timeout performed Patient Re-evaluated:Patient Re-evaluated prior to inductionOxygen Delivery Method: Circle system utilized Preoxygenation: Pre-oxygenation with 100% oxygen Intubation Type: IV induction Ventilation: Mask ventilation without difficulty Laryngoscope Size: Mac and 3 Grade View: Grade I Tube type: Oral Tube size: 7.5 mm Number of attempts: 1 Airway Equipment and Method: Stylet Placement Confirmation: ETT inserted through vocal cords under direct vision,  positive ETCO2 and breath sounds checked- equal and bilateral Secured at: 21 cm Tube secured with: Tape Dental Injury: Teeth and Oropharynx as per pre-operative assessment

## 2015-06-07 LAB — RENAL FUNCTION PANEL
ALBUMIN: 2.7 g/dL — AB (ref 3.5–5.0)
Anion gap: 10 (ref 5–15)
BUN: 11 mg/dL (ref 6–20)
CALCIUM: 9.2 mg/dL (ref 8.9–10.3)
CO2: 26 mmol/L (ref 22–32)
CREATININE: 1.78 mg/dL — AB (ref 0.61–1.24)
Chloride: 103 mmol/L (ref 101–111)
GFR calc Af Amer: >60 mL/min (ref >60)
GFR, EST NON AFRICAN AMERICAN: 54 mL/min — AB (ref >60)
GLUCOSE: 100 mg/dL — AB (ref 65–99)
PHOSPHORUS: 5.7 mg/dL — AB (ref 2.5–4.6)
Potassium: 3.8 mmol/L (ref 3.5–5.1)
SODIUM: 139 mmol/L (ref 135–145)

## 2015-06-07 LAB — GLUCOSE, CAPILLARY
GLUCOSE-CAPILLARY: 100 mg/dL — AB (ref 65–99)
Glucose-Capillary: 122 mg/dL — ABNORMAL HIGH (ref 65–99)
Glucose-Capillary: 98 mg/dL (ref 65–99)
Glucose-Capillary: 99 mg/dL (ref 65–99)

## 2015-06-07 MED ORDER — HYDROMORPHONE HCL 1 MG/ML IJ SOLN
1.0000 mg | Freq: Once | INTRAMUSCULAR | Status: AC
  Administered 2015-06-07: 1 mg via INTRAVENOUS
  Filled 2015-06-07: qty 1

## 2015-06-07 MED ORDER — HYDROMORPHONE HCL 1 MG/ML IJ SOLN
1.0000 mg | INTRAMUSCULAR | Status: DC | PRN
  Administered 2015-06-07 – 2015-06-19 (×60): 1 mg via INTRAVENOUS
  Filled 2015-06-07 (×62): qty 1
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
## 2015-06-07 NOTE — Progress Notes (Signed)
Orthopaedic Trauma Service (OTS)  Subjective: 1 Day Post-Op Procedure(s) (LRB): IRRIGATION AND DEBRIDEMENT WOUND (N/A) Patient reports pain as mild currently but rather severe at one point last night.  Does not feel that oxycodone products are addressing his pain though they do produce some mental sedation per his family's report.  Objective: Current Vitals Blood pressure 143/100, pulse 92, temperature 98.1 F (36.7 C), temperature source Oral, resp. rate 18, height  (1.676 m), weight 258 lb (117.028 kg), SpO2 100 %. Vital signs in last 24 hours: Temp:  [98.1 F (36.7 C)-99.2 F (37.3 C)] 98.1 F (36.7 C) (09/24 0816) Pulse Rate:  [62-97] 92 (09/24 0816) Resp:  [14-18] 18 (09/24 0816) BP: (132-152)/(77-100) 143/100 mmHg (09/24 0816) SpO2:  [97 %-100 %] 100 % (09/24 0816)  Intake/Output from previous day: 09/23 0701 - 09/24 0700 In: 1750 [P.O.:400; I.V.:1350] Out: 1610 [Urine:6352; Drains:75]  LABS No results for input(s): HGB in the last 72 hours. No results for input(s): WBC, RBC, HCT, PLT in the last 72 hours.  Recent Labs  06/06/15 0458 06/07/15 0520  NA 137 139  K 3.8 3.8  CL 104 103  CO2 25 26  BUN 10 11  CREATININE 1.77* 1.78*  GLUCOSE 97 100*  CALCIUM 9.4 9.2   No results for input(s): LABPT, INR in the last 72 hours.   Physical Exam  Vac holding.  No erythema whatsoever. No drainage. R&LLE   Edema/ swelling controlled  Sens: DPN, SPN, TN intact  Motor: EHL, FHL, and lessor toe ext and flex all intact grossly  DP2+, Brisk cap refill, warm to touch  Imaging No results found.  Assessment/Plan: 1 Day Post-Op Procedure(s) (LRB): IRRIGATION AND DEBRIDEMENT WOUND (N/A)  Given the persistence and recurrence of this infection and his now 6 wks from repair, and his anticipated multiple procedures still to come to achieve closure of the large abdominal wound, I am recommending removal of the hardware on Monday in addition to repeat debridement.   Risks include loss of reduction and need for further surgery in addition to all the previous risks discussed.  Myrene Galas, MD [facility] [phone] (p)   06/07/2015, 9:54 AM

## 2015-06-08 LAB — CBC
HCT: 29.8 % — ABNORMAL LOW (ref 39.0–52.0)
HEMOGLOBIN: 9.7 g/dL — AB (ref 13.0–17.0)
MCH: 27 pg (ref 26.0–34.0)
MCHC: 32.6 g/dL (ref 30.0–36.0)
MCV: 83 fL (ref 78.0–100.0)
Platelets: 403 10*3/uL — ABNORMAL HIGH (ref 150–400)
RBC: 3.59 MIL/uL — ABNORMAL LOW (ref 4.22–5.81)
RDW: 12.8 % (ref 11.5–15.5)
WBC: 5.6 10*3/uL (ref 4.0–10.5)

## 2015-06-08 LAB — RENAL FUNCTION PANEL
ALBUMIN: 2.9 g/dL — AB (ref 3.5–5.0)
ANION GAP: 10 (ref 5–15)
BUN: 11 mg/dL (ref 6–20)
CALCIUM: 9.5 mg/dL (ref 8.9–10.3)
CO2: 26 mmol/L (ref 22–32)
Chloride: 102 mmol/L (ref 101–111)
Creatinine, Ser: 1.64 mg/dL — ABNORMAL HIGH (ref 0.61–1.24)
GFR, EST NON AFRICAN AMERICAN: 60 mL/min — AB (ref >60)
GLUCOSE: 122 mg/dL — AB (ref 65–99)
PHOSPHORUS: 5 mg/dL — AB (ref 2.5–4.6)
Potassium: 3.6 mmol/L (ref 3.5–5.1)
SODIUM: 138 mmol/L (ref 135–145)

## 2015-06-08 LAB — GLUCOSE, CAPILLARY
GLUCOSE-CAPILLARY: 85 mg/dL (ref 65–99)
GLUCOSE-CAPILLARY: 116 mg/dL — AB (ref 65–99)
Glucose-Capillary: 117 mg/dL — ABNORMAL HIGH (ref 65–99)
Glucose-Capillary: 114 mg/dL — ABNORMAL HIGH (ref 65–99)

## 2015-06-08 NOTE — Progress Notes (Signed)
Orthopaedic Trauma Service (OTS)  Subjective: 2 Days Post-Op Procedure(s) (LRB): IRRIGATION AND DEBRIDEMENT WOUND (N/A) Patient reports pain as mild.   C/o spontaneous purulent drainage from old traumatic wound left thigh.  Objective: Current Vitals Blood pressure 144/93, pulse 84, temperature 98.2 F (36.8 C), temperature source Oral, resp. rate 18, height  (1.676 m), weight 258 lb (117.028 kg), SpO2 100 %. Vital signs in last 24 hours: Temp:  [98.2 F (36.8 C)-98.8 F (37.1 C)] 98.2 F (36.8 C) (09/25 0512) Pulse Rate:  [82-85] 84 (09/25 0512) Resp:  [17-18] 18 (09/25 0512) BP: (144-161)/(82-93) 144/93 mmHg (09/25 0512) SpO2:  [100 %] 100 % (09/25 0512)  Intake/Output from previous day: 09/24 0701 - 09/25 0700 In: 1014 [P.O.:854; I.V.:160] Out: 1700 [Urine:1600; Drains:100]  LABS  Recent Labs  06/08/15 0457  HGB 9.7*    Recent Labs  06/08/15 0457  WBC 5.6  RBC 3.59*  HCT 29.8*  PLT 403*    Recent Labs  06/07/15 0520 06/08/15 0457  NA 139 138  K 3.8 3.6  CL 103 102  CO2 26 26  BUN 11 11  CREATININE 1.78* 1.64*  GLUCOSE 100* 122*  CALCIUM 9.2 9.5   No results for input(s): LABPT, INR in the last 72 hours.  Physical Exam  Pelvic wound vac holding; no erythema Left thigh purulent drainage of several cc's, abscess noted along wound line but does not appear to extend deep into the subcutaneous tissues with pocket being 2x2cm's   Imaging No results found.  Assessment/Plan: 2 Days Post-Op Procedure(s) (LRB): IRRIGATION AND DEBRIDEMENT WOUND (N/A)  1. Left thigh culture sent 2. Removal of hardware and repeat vac of abdomen tomorrow 3. Consent signed--I discussed with the patient the risks and benefits of surgery, including the possibility of infection, nerve injury, vessel injury, wound breakdown, arthritis, loss of reduction, DVT/ PE, loss of motion, and need for further surgery among others.  We also specifically discussed the need for  serial surgeries to obtain closure.  He understood these risks and wished to proceed.  Myrene Galas, MD [facility] [phone] (p)   06/08/2015, 11:43 AM
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
## 2015-06-09 ENCOUNTER — Inpatient Hospital Stay (HOSPITAL_COMMUNITY): Payer: Commercial Managed Care - HMO | Admitting: Anesthesiology

## 2015-06-09 ENCOUNTER — Encounter (HOSPITAL_COMMUNITY)
Admission: EM | Disposition: A | Discharge status: Home / Self Care | Payer: Self-pay | Source: Home / Self Care | Attending: Orthopedic Surgery

## 2015-06-09 ENCOUNTER — Encounter (HOSPITAL_COMMUNITY): Payer: Self-pay | Admitting: Anesthesiology

## 2015-06-09 HISTORY — PX: I & D EXTREMITY: SHX5045

## 2015-06-09 LAB — GRAM STAIN

## 2015-06-09 LAB — RENAL FUNCTION PANEL
ANION GAP: 9 (ref 5–15)
Albumin: 3.1 g/dL — ABNORMAL LOW (ref 3.5–5.0)
BUN: 10 mg/dL (ref 6–20)
CALCIUM: 9.1 mg/dL (ref 8.9–10.3)
CO2: 24 mmol/L (ref 22–32)
CREATININE: 1.39 mg/dL — AB (ref 0.61–1.24)
Chloride: 108 mmol/L (ref 101–111)
Glucose, Bld: 118 mg/dL — ABNORMAL HIGH (ref 65–99)
Phosphorus: 4.3 mg/dL (ref 2.5–4.6)
Potassium: 3.9 mmol/L (ref 3.5–5.1)
SODIUM: 141 mmol/L (ref 135–145)

## 2015-06-09 LAB — GLUCOSE, CAPILLARY
GLUCOSE-CAPILLARY: 114 mg/dL — AB (ref 65–99)
GLUCOSE-CAPILLARY: 116 mg/dL — AB (ref 65–99)
Glucose-Capillary: 104 mg/dL — ABNORMAL HIGH (ref 65–99)

## 2015-06-09 LAB — ABO/RH: ABO/RH(D): A POS

## 2015-06-09 LAB — TYPE AND SCREEN
ABO/RH(D): A POS
Antibody Screen: NEGATIVE

## 2015-06-09 SURGERY — IRRIGATION AND DEBRIDEMENT EXTREMITY
Anesthesia: General | Site: Pelvis

## 2015-06-09 MED ORDER — MIDAZOLAM HCL 5 MG/5ML IJ SOLN
INTRAMUSCULAR | Status: DC | PRN
  Administered 2015-06-09: 2 mg via INTRAVENOUS

## 2015-06-09 MED ORDER — HYDROMORPHONE HCL 1 MG/ML IJ SOLN
INTRAMUSCULAR | Status: AC
  Filled 2015-06-09: qty 1

## 2015-06-09 MED ORDER — MIDAZOLAM HCL 2 MG/2ML IJ SOLN
INTRAMUSCULAR | Status: AC
  Filled 2015-06-09: qty 4

## 2015-06-09 MED ORDER — ALBUMIN HUMAN 5 % IV SOLN
INTRAVENOUS | Status: DC | PRN
  Administered 2015-06-09 (×2): via INTRAVENOUS

## 2015-06-09 MED ORDER — FENTANYL CITRATE (PF) 100 MCG/2ML IJ SOLN
50.0000 ug | Freq: Once | INTRAMUSCULAR | Status: AC
  Administered 2015-06-09: 50 ug via INTRAVENOUS

## 2015-06-09 MED ORDER — SODIUM CHLORIDE 0.9 % IV SOLN: INTRAVENOUS | Status: DC

## 2015-06-09 MED ORDER — HYDROMORPHONE HCL 1 MG/ML IJ SOLN
0.5000 mg | INTRAMUSCULAR | Status: AC
  Administered 2015-06-09 (×4): 0.5 mg via INTRAVENOUS

## 2015-06-09 MED ORDER — ROCURONIUM BROMIDE 100 MG/10ML IV SOLN
INTRAVENOUS | Status: DC | PRN
  Administered 2015-06-09: 20 mg via INTRAVENOUS
  Administered 2015-06-09: 30 mg via INTRAVENOUS

## 2015-06-09 MED ORDER — FENTANYL CITRATE (PF) 100 MCG/2ML IJ SOLN
INTRAMUSCULAR | Status: DC | PRN
  Administered 2015-06-09: 200 ug via INTRAVENOUS
  Administered 2015-06-09 (×6): 50 ug via INTRAVENOUS

## 2015-06-09 MED ORDER — FENTANYL CITRATE (PF) 100 MCG/2ML IJ SOLN
INTRAMUSCULAR | Status: AC
  Filled 2015-06-09: qty 2

## 2015-06-09 MED ORDER — SODIUM CHLORIDE 0.9 % IV SOLN: INTRAVENOUS | Status: DC | PRN

## 2015-06-09 MED ORDER — LIDOCAINE HCL (CARDIAC) 20 MG/ML IV SOLN
INTRAVENOUS | Status: AC
  Filled 2015-06-09: qty 5

## 2015-06-09 MED ORDER — SODIUM CHLORIDE 0.9 % IV SOLN: Freq: Once | INTRAVENOUS | Status: DC

## 2015-06-09 MED ORDER — HYDROMORPHONE HCL 1 MG/ML IJ SOLN
0.2500 mg | INTRAMUSCULAR | Status: DC | PRN
  Administered 2015-06-09 (×3): 0.5 mg via INTRAVENOUS

## 2015-06-09 MED ORDER — SODIUM CHLORIDE 0.9 % IV SOLN
INTRAVENOUS | Status: DC | PRN
  Administered 2015-06-09: 13:00:00 via INTRAVENOUS

## 2015-06-09 MED ORDER — FENTANYL CITRATE (PF) 250 MCG/5ML IJ SOLN
INTRAMUSCULAR | Status: AC
  Filled 2015-06-09: qty 5

## 2015-06-09 MED ORDER — LIDOCAINE HCL (CARDIAC) 20 MG/ML IV SOLN
INTRAVENOUS | Status: DC | PRN
  Administered 2015-06-09: 100 mg via INTRAVENOUS

## 2015-06-09 MED ORDER — ONDANSETRON HCL 4 MG/2ML IJ SOLN: 4.0000 mg | Freq: Once | INTRAMUSCULAR | Status: DC | PRN

## 2015-06-09 MED ORDER — ONDANSETRON HCL 4 MG/2ML IJ SOLN
INTRAMUSCULAR | Status: DC | PRN
  Administered 2015-06-09: 4 mg via INTRAVENOUS

## 2015-06-09 MED ORDER — 0.9 % SODIUM CHLORIDE (POUR BTL) OPTIME
TOPICAL | Status: DC | PRN
  Administered 2015-06-09: 1000 mL

## 2015-06-09 MED ORDER — PROPOFOL 10 MG/ML IV BOLUS
INTRAVENOUS | Status: AC
  Filled 2015-06-09: qty 20

## 2015-06-09 MED ORDER — SUCCINYLCHOLINE CHLORIDE 20 MG/ML IJ SOLN
INTRAMUSCULAR | Status: DC | PRN
  Administered 2015-06-09: 140 mg via INTRAVENOUS

## 2015-06-09 MED ORDER — MEPERIDINE HCL 25 MG/ML IJ SOLN: 6.2500 mg | INTRAMUSCULAR | Status: DC | PRN

## 2015-06-09 MED ORDER — GLYCOPYRROLATE 0.2 MG/ML IJ SOLN
INTRAMUSCULAR | Status: DC | PRN
  Administered 2015-06-09: 0.4 mg via INTRAVENOUS

## 2015-06-09 MED ORDER — LACTATED RINGERS IV SOLN
INTRAVENOUS | Status: DC | PRN
  Administered 2015-06-09: 14:00:00 via INTRAVENOUS

## 2015-06-09 MED ORDER — PROPOFOL 10 MG/ML IV BOLUS
INTRAVENOUS | Status: DC | PRN
  Administered 2015-06-09: 120 mg via INTRAVENOUS

## 2015-06-09 MED ORDER — NEOSTIGMINE METHYLSULFATE 10 MG/10ML IV SOLN
INTRAVENOUS | Status: DC | PRN
Start: 2015-06-09 — End: 2015-06-09
  Administered 2015-06-09: 3 mg via INTRAVENOUS

## 2015-06-09 SURGICAL SUPPLY — 59 items
BLADE SURG 10 STRL SS (BLADE) ×3 IMPLANT
BNDG COHESIVE 4X5 TAN STRL (GAUZE/BANDAGES/DRESSINGS) ×3 IMPLANT
BNDG GAUZE ELAST 4 BULKY (GAUZE/BANDAGES/DRESSINGS) ×6 IMPLANT
BNDG GAUZE STRTCH 6 (GAUZE/BANDAGES/DRESSINGS) ×9 IMPLANT
BRUSH SCRUB DISP (MISCELLANEOUS) ×6 IMPLANT
CANISTER WOUND CARE 500ML ATS (WOUND CARE) ×2 IMPLANT
CONNECTOR Y ATS VAC SYSTEM (MISCELLANEOUS) ×2 IMPLANT
CONT SPEC 4OZ CLIKSEAL STRL BL (MISCELLANEOUS) ×2 IMPLANT
COVER SURGICAL LIGHT HANDLE (MISCELLANEOUS) ×6 IMPLANT
DRAIN CHANNEL 19F RND (DRAIN) ×2 IMPLANT
DRAPE U-SHAPE 47X51 STRL (DRAPES) ×3 IMPLANT
DRSG ADAPTIC 3X8 NADH LF (GAUZE/BANDAGES/DRESSINGS) ×3 IMPLANT
DRSG MEPILEX BORDER 4X4 (GAUZE/BANDAGES/DRESSINGS) ×2 IMPLANT
DRSG VAC ATS MED SENSATRAC (GAUZE/BANDAGES/DRESSINGS) ×2 IMPLANT
DRSG VAC ATS SM SENSATRAC (GAUZE/BANDAGES/DRESSINGS) ×2 IMPLANT
ELECT BLADE 6.5 EXT (BLADE) ×2 IMPLANT
ELECT CAUTERY BLADE 6.4 (BLADE) IMPLANT
ELECT REM PT RETURN 9FT ADLT (ELECTROSURGICAL)
ELECTRODE REM PT RTRN 9FT ADLT (ELECTROSURGICAL) IMPLANT
EVACUATOR SILICONE 100CC (DRAIN) ×2 IMPLANT
GAUZE SPONGE 2X2 8PLY STRL LF (GAUZE/BANDAGES/DRESSINGS) IMPLANT
GAUZE SPONGE 4X4 12PLY STRL (GAUZE/BANDAGES/DRESSINGS) ×3 IMPLANT
GLOVE BIO SURGEON STRL SZ7.5 (GLOVE) ×3 IMPLANT
GLOVE BIO SURGEON STRL SZ8 (GLOVE) ×3 IMPLANT
GLOVE BIOGEL PI IND STRL 7.5 (GLOVE) ×1 IMPLANT
GLOVE BIOGEL PI IND STRL 8 (GLOVE) ×1 IMPLANT
GLOVE BIOGEL PI INDICATOR 7.5 (GLOVE) ×2
GLOVE BIOGEL PI INDICATOR 8 (GLOVE) ×2
GOWN STRL REUS W/ TWL LRG LVL3 (GOWN DISPOSABLE) ×2 IMPLANT
GOWN STRL REUS W/ TWL XL LVL3 (GOWN DISPOSABLE) ×1 IMPLANT
GOWN STRL REUS W/TWL LRG LVL3 (GOWN DISPOSABLE) ×6
GOWN STRL REUS W/TWL XL LVL3 (GOWN DISPOSABLE) ×3
HANDPIECE INTERPULSE COAX TIP (DISPOSABLE)
KIT BASIN OR (CUSTOM PROCEDURE TRAY) ×3 IMPLANT
KIT ROOM TURNOVER OR (KITS) ×3 IMPLANT
MANIFOLD NEPTUNE II (INSTRUMENTS) ×3 IMPLANT
NS IRRIG 1000ML POUR BTL (IV SOLUTION) ×3 IMPLANT
PACK ORTHO EXTREMITY (CUSTOM PROCEDURE TRAY) ×3 IMPLANT
PAD ARMBOARD 7.5X6 YLW CONV (MISCELLANEOUS) ×6 IMPLANT
PADDING CAST COTTON 6X4 STRL (CAST SUPPLIES) ×3 IMPLANT
PENCIL BUTTON HOLSTER BLD 10FT (ELECTRODE) ×2 IMPLANT
RETRACTOR YANK SUCT EIGR SABER (INSTRUMENTS) IMPLANT
SET HNDPC FAN SPRY TIP SCT (DISPOSABLE) IMPLANT
SPONGE GAUZE 2X2 STER 10/PKG (GAUZE/BANDAGES/DRESSINGS) ×2
SPONGE LAP 18X18 X RAY DECT (DISPOSABLE) ×3 IMPLANT
STAPLER VISISTAT 35W (STAPLE) ×2 IMPLANT
STOCKINETTE IMPERVIOUS 9X36 MD (GAUZE/BANDAGES/DRESSINGS) ×3 IMPLANT
SUT ETHILON 2 LR (SUTURE) ×2 IMPLANT
SUT PDS AB 2-0 CT1 27 (SUTURE) IMPLANT
SWAB COLLECTION DEVICE MRSA (MISCELLANEOUS) ×4 IMPLANT
SYR BULB IRRIGATION 50ML (SYRINGE) ×2 IMPLANT
TOWEL OR 17X24 6PK STRL BLUE (TOWEL DISPOSABLE) ×3 IMPLANT
TOWEL OR 17X26 10 PK STRL BLUE (TOWEL DISPOSABLE) ×6 IMPLANT
TUBE ANAEROBIC SPECIMEN COL (MISCELLANEOUS) ×4 IMPLANT
TUBE CONNECTING 12'X1/4 (SUCTIONS) ×1
TUBE CONNECTING 12X1/4 (SUCTIONS) ×2 IMPLANT
UNDERPAD 30X30 INCONTINENT (UNDERPADS AND DIAPERS) ×3 IMPLANT
WATER STERILE IRR 1000ML POUR (IV SOLUTION) ×3 IMPLANT
YANKAUER SUCT BULB TIP NO VENT (SUCTIONS) ×7 IMPLANT

## 2015-06-09 NOTE — Anesthesia Postprocedure Evaluation (Signed)
  Anesthesia Post-op Note  Patient: Mathew Norman  Procedure(s) Performed: Procedure(s): REPEAT IRRIGATION AND DEBRIDEMENT PELVIC ABCESS, REMOVAL OF HARDWARE (N/A)  Patient Location: PACU  Anesthesia Type:General  Level of Consciousness: awake, sedated and patient cooperative  Airway and Oxygen Therapy: Patient Spontanous Breathing  Post-op Pain: mild  Post-op Assessment: Post-op Vital signs reviewed LLE Motor Response: Purposeful movement LLE Sensation: Pain          Post-op Vital Signs: stable  Last Vitals:  Filed Vitals:   06/09/15 1712  BP: 151/82  Pulse: 102  Temp:   Resp: 15    Complications: No apparent anesthesia complications

## 2015-06-09 NOTE — Progress Notes (Signed)
Pt placed on tele to monitor 

## 2015-06-09 NOTE — Progress Notes (Signed)
PT Cancellation Note  Patient Details Name: VIVIANO BIR MRN: [id number] DOB: 07-22-1996   Cancelled Treatment:    Reason Eval/Treat Not Completed: Patient at procedure or test/unavailable Pt in OR. Will follow up next available time.  [name] A Artemis Koller 06/09/2015, 11:30 AM  Mylo Red, PT, DPT [phone]

## 2015-06-09 NOTE — Anesthesia Preprocedure Evaluation (Signed)
Anesthesia Evaluation  Patient identified by MRN, date of birth, ID band Patient awake    Reviewed: Allergy & Precautions, NPO status , Patient's Chart, lab work & pertinent test results  Airway Mallampati: I  TM Distance: >3 FB Neck ROM: Full    Dental   Pulmonary    Pulmonary exam normal        Cardiovascular Normal cardiovascular exam     Neuro/Psych    GI/Hepatic   Endo/Other    Renal/GU      Musculoskeletal   Abdominal   Peds  Hematology   Anesthesia Other Findings   Reproductive/Obstetrics                             Anesthesia Physical Anesthesia Plan  ASA: II  Anesthesia Plan: General   Post-op Pain Management:    Induction: Intravenous  Airway Management Planned: Oral ETT  Additional Equipment:   Intra-op Plan:   Post-operative Plan: Extubation in OR  Informed Consent: I have reviewed the patients History and Physical, chart, labs and discussed the procedure including the risks, benefits and alternatives for the proposed anesthesia with the patient or authorized representative who has indicated his/her understanding and acceptance.     Plan Discussed with: CRNA and Surgeon  Anesthesia Plan Comments:         Anesthesia Quick Evaluation  

## 2015-06-09 NOTE — Transfer of Care (Signed)
Immediate Anesthesia Transfer of Care Note  Patient: Mathew Norman  Procedure(s) Performed: Procedure(s): REPEAT IRRIGATION AND DEBRIDEMENT PELVIC ABCESS, POSSIBLE REMOVAL OF HARDWARE (N/A)  Patient Location: PACU  Anesthesia Type:General  Level of Consciousness: awake, alert , oriented and patient cooperative  Airway & Oxygen Therapy: Patient Spontanous Breathing  Post-op Assessment: Report given to RN and Post -op Vital signs reviewed and stable  Post vital signs: Reviewed and stable  Last Vitals:  Filed Vitals:   06/09/15 1235  BP:   Pulse: 79  Temp:   Resp: 16    Complications: No apparent anesthesia complications

## 2015-06-09 NOTE — Brief Op Note (Signed)
05/26/2015 - 06/09/2015  4:18 PM  PATIENT:  Mathew Norman  19 y.o. male  PRE-OPERATIVE DIAGNOSIS:   1. PELVIC ABSCESS  2. LEFT THIGH ABSCESS  POST-OPERATIVE DIAGNOSIS:  1. PELVIC ABSCESS  2. LEFT THIGH ABSCESS  PROCEDURE:  Procedure(s): 1. INCISION AND DRAINAGE OF DEEP PELVIC ABCESS 2. REMOVAL OF HARDWARE (N/A) 3. APPLICATION OF LARGE WOUND VAC ABSCESS 4. INCISION AND DRAINAGE LEFT THIGH WITH EXCISIONAL DEBRIDEMENT OF SKIN, SUBCUTANEOUS TISSUE, AND FASCIA 5. APPLICATION OF SMALL WOUND VAC LEFT THIGH  SURGEON:  Surgeon(s) and Role:    * Myrene Galas, MD - Primary  PHYSICIAN ASSISTANT: [name], PA-C  ANESTHESIA:   general  I/O:  Total I/O In: 2200 [I.V.:1700; IV Piggyback:500] Out: 1600 [Urine:350; Blood:1250]  SPECIMEN:  Source of Specimen:  1. LEFT THIGH--old suture and abscess wall tissue; 2. LEFT THIGH swabs; 3. PELVIC ABSCESS--deep sciatric buttress  DISPOSITION OF SPECIMEN:  To micro  TOURNIQUET:  * No tourniquets in log *  DICTATION: .Other Dictation: Dictation Number [id number]

## 2015-06-09 NOTE — Anesthesia Procedure Notes (Signed)
Procedure Name: Intubation Date/Time: 06/09/2015 1:31 PM Performed by: Jeani Hawking Pre-anesthesia Checklist: Patient identified, Emergency Drugs available, Suction available, Patient being monitored and Timeout performed Patient Re-evaluated:Patient Re-evaluated prior to inductionOxygen Delivery Method: Circle system utilized Preoxygenation: Pre-oxygenation with 100% oxygen Intubation Type: IV induction Ventilation: Mask ventilation without difficulty Laryngoscope Size: Mac and 4 Grade View: Grade I Tube type: Oral Tube size: 7.5 mm Number of attempts: 1 Airway Equipment and Method: Stylet Placement Confirmation: ETT inserted through vocal cords under direct vision,  positive ETCO2 and breath sounds checked- equal and bilateral Secured at: 22 cm Tube secured with: Tape Dental Injury: Teeth and Oropharynx as per pre-operative assessment  Comments: Intubated by Cristela Felt, SRNA

## 2015-06-10 ENCOUNTER — Encounter (HOSPITAL_COMMUNITY): Payer: Self-pay | Admitting: Orthopedic Surgery

## 2015-06-10 DIAGNOSIS — S329XXG Fracture of unspecified parts of lumbosacral spine and pelvis, subsequent encounter for fracture with delayed healing: Secondary | ICD-10-CM

## 2015-06-10 DIAGNOSIS — L02416 Cutaneous abscess of left lower limb: Secondary | ICD-10-CM

## 2015-06-10 DIAGNOSIS — Z9889 Other specified postprocedural states: Secondary | ICD-10-CM

## 2015-06-10 LAB — CBC
HEMATOCRIT: 26.7 % — AB (ref 39.0–52.0)
Hemoglobin: 8.6 g/dL — ABNORMAL LOW (ref 13.0–17.0)
MCH: 27 pg (ref 26.0–34.0)
MCHC: 32.2 g/dL (ref 30.0–36.0)
MCV: 83.7 fL (ref 78.0–100.0)
PLATELETS: 367 10*3/uL (ref 150–400)
RBC: 3.19 MIL/uL — AB (ref 4.22–5.81)
RDW: 12.9 % (ref 11.5–15.5)
WBC: 6.4 10*3/uL (ref 4.0–10.5)

## 2015-06-10 LAB — CULTURE, ROUTINE-ABSCESS: Culture: NO GROWTH

## 2015-06-10 LAB — GLUCOSE, CAPILLARY
GLUCOSE-CAPILLARY: 94 mg/dL (ref 65–99)
Glucose-Capillary: 106 mg/dL — ABNORMAL HIGH (ref 65–99)
Glucose-Capillary: 137 mg/dL — ABNORMAL HIGH (ref 65–99)
Glucose-Capillary: 104 mg/dL — ABNORMAL HIGH (ref 65–99)

## 2015-06-10 LAB — POCT I-STAT 4, (NA,K, GLUC, HGB,HCT)
Glucose, Bld: 111 mg/dL — ABNORMAL HIGH (ref 65–99)
HEMATOCRIT: 28 % — AB (ref 39.0–52.0)
HEMOGLOBIN: 9.5 g/dL — AB (ref 13.0–17.0)
POTASSIUM: 4.2 mmol/L (ref 3.5–5.1)
Sodium: 141 mmol/L (ref 135–145)

## 2015-06-10 LAB — ERYTHROPOIETIN: Erythropoietin: 10 m[IU]/mL (ref 2.6–18.5)

## 2015-06-10 MED ORDER — TRAMADOL HCL 50 MG PO TABS
50.0000 mg | ORAL_TABLET | Freq: Three times a day (TID) | ORAL | Status: DC | PRN
  Administered 2015-06-10 – 2015-06-15 (×2): 100 mg via ORAL
  Filled 2015-06-10 (×3): qty 2

## 2015-06-10 MED ORDER — SODIUM CHLORIDE 0.9 % IV SOLN
1.0000 g | Freq: Three times a day (TID) | INTRAVENOUS | Status: DC
  Administered 2015-06-10 – 2015-06-13 (×10): 1 g via INTRAVENOUS
  Filled 2015-06-10 (×12): qty 1

## 2015-06-10 MED ORDER — METHOCARBAMOL 1000 MG/10ML IJ SOLN
500.0000 mg | Freq: Four times a day (QID) | INTRAMUSCULAR | Status: DC
  Administered 2015-06-10 – 2015-06-18 (×6): 500 mg via INTRAVENOUS
  Filled 2015-06-10 (×40): qty 5

## 2015-06-10 MED ORDER — HYDROCODONE-ACETAMINOPHEN 5-325 MG PO TABS
1.0000 | ORAL_TABLET | Freq: Four times a day (QID) | ORAL | Status: DC | PRN
Start: 2015-06-10 — End: 2015-06-10
  Administered 2015-06-10 (×2): 2 via ORAL
  Filled 2015-06-10 (×2): qty 2

## 2015-06-10 MED ORDER — HYDROCODONE-ACETAMINOPHEN 10-325 MG PO TABS
1.0000 | ORAL_TABLET | Freq: Four times a day (QID) | ORAL | Status: DC | PRN
  Administered 2015-06-10 – 2015-06-19 (×23): 2 via ORAL
  Filled 2015-06-10 (×27): qty 2

## 2015-06-10 MED ORDER — METHOCARBAMOL 500 MG PO TABS
1000.0000 mg | ORAL_TABLET | Freq: Four times a day (QID) | ORAL | Status: DC
Start: 2015-06-10 — End: 2015-06-19
  Administered 2015-06-10 – 2015-06-19 (×28): 1000 mg via ORAL
  Filled 2015-06-10 (×30): qty 2

## 2015-06-10 MED ORDER — SODIUM CHLORIDE 0.9 % IV SOLN
700.0000 mg | INTRAVENOUS | Status: DC
  Administered 2015-06-10 – 2015-06-12 (×3): 700 mg via INTRAVENOUS
  Filled 2015-06-10 (×4): qty 14
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
## 2015-06-10 NOTE — Progress Notes (Signed)
[facility]thopaedic Trauma Service (OTS)  Subjective: 1 Day Post-Op Procedure(s) (LRB): REPEAT IRRIGATION AND DEBRIDEMENT PELVIC ABCESS, REMOVAL OF HARDWARE (N/A) Patient reports pain as moderately severe.   Requests increase in pain meds.  Poor mobilization secondary to pain. Denies numbness, weakness, and tingling distally.  Objective: Current Vitals Blood pressure 158/88, pulse 99, temperature 98.6 F (37 C), temperature source Oral, resp. rate 18, height  (1.676 m), weight 258 lb (117.028 kg), SpO2 100 %. Vital signs in last 24 hours: Temp:  [98.2 F (36.8 C)-98.7 F (37.1 C)] 98.6 F (37 C) (09/27 0436) Pulse Rate:  [79-110] 99 (09/27 0436) Resp:  [12-20] 18 (09/27 0436) BP: (145-186)/(80-95) 158/88 mmHg (09/27 0528) SpO2:  [98 %-100 %] 100 % (09/27 0436)  Intake/Output from previous day: 09/26 0701 - 09/27 0700 In: 3299 [P.O.:576; I.V.:2223; IV Piggyback:500] Out: 2920 [Urine:1400; Drains:270; Blood:1250]  LABS  Recent Labs  06/08/15 0457 06/10/15 0032  HGB 9.7* 8.6*    Recent Labs  06/08/15 0457 06/10/15 0032  WBC 5.6 6.4  RBC 3.59* 3.19*  HCT 29.8* 26.7*  PLT 403* 367    Recent Labs  06/08/15 0457 06/09/15 1840  NA 138 141  K 3.6 3.9  CL 102 108  CO2 26 24  BUN 11 10  CREATININE 1.64* 1.39*  GLUCOSE 122* 118*  CALCIUM 9.5 9.1   No results for input(s): LABPT, INR in the last 72 hours.  Physical Exam  Pelvis vac holding and sealed LEft thigh vac holding and sealed R&LLE Dressing intact, clean, dry  Edema/ swelling controlled  Sens: DPN, SPN, TN intact  Motor: EHL, FHL, and lessor toe ext and flex all intact grossly  Brisk cap refill, warm to touch  Imaging No results found.  Assessment/Plan: 1 Day Post-Op Procedure(s) (LRB): REPEAT IRRIGATION AND DEBRIDEMENT PELVIC ABCESS, REMOVAL OF HARDWARE (N/A)  Will repeat x-rays later this week Return to OR on Thursday Will f/u with Dr. Ninetta Lights today re finding deep pelvic abscess as well as  new left thigh abscess Adjust pain med regimen Mobilize bed to chair; ROM  Lovenox  Myrene Galas, MD [facility] [phone] (p)   06/10/2015, 9:41 AM

## 2015-06-10 NOTE — Progress Notes (Signed)
PT Cancellation Note  Patient Details Name: [name] MRN: [id number] DOB: [birth year]   Cancelled Treatment:    Reason Eval/Treat Not Completed: Pain limiting ability to participate. Patient reported (and confirmed by RN) that he had just gotten back into bed and comfortable from being in increased pain this afternoon. Did not want to get OOB again at this time. Per patient, he reported that he is back to transfers only. Did note in progress note from MD that he wrote bed to chair however not confirmed in orders. May need clarification now that hardware has been removed   Robinette, Adline Potter 06/10/2015, 2:53 PM
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
## 2015-06-10 NOTE — Op Note (Signed)
NAMEMarland Kitchen  [name], [name] NO.:  [id number]  MEDICAL RECORD NO.:  [id number]  LOCATION:  [id number]                        FACILITY:  [facility]  PHYSICIAN:  Doralee Albino. Carola Frost, M.D. DATE OF BIRTH:  01/17/1996  DATE OF PROCEDURE:  06/09/2015 DATE OF DISCHARGE:                              OPERATIVE REPORT   PREOPERATIVE DIAGNOSES: 1. Persistent pelvic abscess. 2. New left thigh abscess.  POSTOPERATIVE DIAGNOSES: 1. Persistent pelvic abscess. 2. New left thigh abscess.  PROCEDURES: 1. Incision and drainage of deep pelvic abscess. 2. Removal of deep hardware, pelvic brim and acetabulum. 3. Application of large wound VAC pelvic abscess. 4. Incision and drainage of left thigh with excisional debridement of     the skin and subcutaneous tissue and fascia. 5. Application of a small wound VAC, left thigh.  SURGEON:  Doralee Albino. Carola Frost, M.D.  ASSISTANT:  Mearl Latin, [facility].  SECOND ASSISTANT:  PA student.  ANESTHESIA:  General.  COMPLICATIONS:  None.  IS/OS:  1700 mL crystalloid, 500 mL colloid/UOP 350 mL, EBL 50 mL.  SPECIMENS:  Three: 1. Left thigh old suture and abscess wall tissue. 2. Left thigh anaerobic and aerobic swabs. 3. Pelvic abscess, deep sciatic buttress.  Disposition of all specimens to micro.  FINDINGS:  Pending.  DISPOSITION:  To PACU.  CONDITION:  Stable.  BRIEF SUMMARY AND INDICATIONS FOR PROCEDURE:  Mathew Norman is a very pleasant, 19 year old male, who has had multiple procedures related to late occurring infection and deep pelvic abscess.  He developed new onset of drainage from his left thigh, recurrence, as well as persistent difficulty obtaining infection control about the pelvic abscess.  As a result, we have recommended removal of hardware, and the patient agrees. We did discuss the risks which include, loss of reduction, need for further surgery, pelvic instability, and continued inability to resolve the infection.  He wished to  proceed.  BRIEF SUMMARY OF PROCEDURE:  Mathew Norman remained on ceftriaxone per the direction of the ID Service.  He was taken to the operating room where general anesthesia was induced.  His left thigh as well as the pelvic area were prepped and draped in usual sterile fashion.  Began with the left thigh, keeping the pelvic region isolated from it, I was able to express 1 mL of purulence from the thigh.  A 4-cm incision was made along this old traumatic wound, and we encountered some purulent material, but it was not excessively loculated and again only constituted a few mL.  A curette was used to remove the fibrinous material around this region, and this along with old suture was sent to [employer].  We also took several swabs from the area.  It appeared to track along the monofilament used to reapproximate the thigh in some areas. It was irrigated thoroughly and then a small wound VAC placed into the 4 x 3 cavity.  Next, attention was turned proximally.  The pelvic hardware was assessed and still found to be having secured purchase.  The hip was brought into 45 degrees of flexion, and we continued dissection along the brim and directly over the plate, I did identify the previous ligation of the corona  mortis and was careful to avoid this.  As I continued to the sciatic buttress and quadrilateral plate, we encountered a large pelvic abscess.  I did culture this with both anaerobic and aerobic swabs, results are pending at this time. This was evacuated with suction and it was thoroughly irrigated.  We followed with removal of all the screws and hardware protecting the bladder with a malleable retractor.  We had placed a Foley to decompress the bladder as well.  I then scraped the screw holes with a curette for any loose fibrinous material.  We irrigated thoroughly with several 1000 mL of normal saline and also use of a chlorhexidine soaked diluted solution to additionally facilitate removal  of the fibrinous material and infection.  A deep JP drain was placed into the abscess and brought out through a separate incision along the left side of the wound and then a wound VAC placed directly over the brim using two #2 nylon sutures in vertical mattress technique to assist with controlling retraction and inversion at the skin edge.  [name], PA-C, assisted me throughout and we also had a second assist given the depth of the abscess and dissection.  PROGNOSIS:  Mathew Norman will return to the OR on Thursday for yet another debridement and re-application of a wound VAC.  We will follow up his cultures and I keep the Infectious Disease Service informed of these developments.  This may not be his last procedure.     Doralee Albino. Carola Frost, M.D.     MHH/MEDQ  D:  06/09/2015  T:  06/10/2015  Job:  [id number]

## 2015-06-11 LAB — RENAL FUNCTION PANEL
Albumin: 2.9 g/dL — ABNORMAL LOW (ref 3.5–5.0)
Anion gap: 8 (ref 5–15)
BUN: 7 mg/dL (ref 6–20)
CHLORIDE: 104 mmol/L (ref 101–111)
CO2: 26 mmol/L (ref 22–32)
CREATININE: 1.13 mg/dL (ref 0.61–1.24)
Calcium: 9.1 mg/dL (ref 8.9–10.3)
GFR calc Af Amer: >60 mL/min (ref >60)
Glucose, Bld: 95 mg/dL (ref 65–99)
Phosphorus: 4.6 mg/dL (ref 2.5–4.6)
Potassium: 3.5 mmol/L (ref 3.5–5.1)
Sodium: 138 mmol/L (ref 135–145)

## 2015-06-11 LAB — WOUND CULTURE: CULTURE: NO GROWTH

## 2015-06-11 LAB — GLUCOSE, CAPILLARY
GLUCOSE-CAPILLARY: 103 mg/dL — AB (ref 65–99)
Glucose-Capillary: 140 mg/dL — ABNORMAL HIGH (ref 65–99)
Glucose-Capillary: 117 mg/dL — ABNORMAL HIGH (ref 65–99)
Glucose-Capillary: 91 mg/dL (ref 65–99)

## 2015-06-11 LAB — CK: Total CK: 213 U/L (ref 49–397)
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
## 2015-06-11 NOTE — Progress Notes (Signed)
[facility] Progress Note  Subjective  Doing ok Feels better since plates removed ID service changed abx to meropenem and daptomycin   No specific complaints today   Review of Systems  Constitutional: Negative for fever and chills.  Respiratory: Negative for shortness of breath and wheezing.   Cardiovascular: Negative for chest pain and palpitations.  Gastrointestinal: Negative for nausea, vomiting and abdominal pain.  Neurological: Negative for tingling and sensory change.     Objective   BP 143/88 mmHg  Pulse 87  Temp(Src) 98.2 F (36.8 C) (Oral)  Resp 16  Ht 5' 6"  (1.676 m)  Wt 117.028 kg (258 lb)  BMI 41.66 kg/m2  SpO2 99%  Intake/Output      09/27 0701 - 09/28 0700 09/28 0701 - 09/29 0700   P.O. 700    I.V. (mL/kg)     IV Piggyback     Total Intake(mL/kg) 700 (6)    Urine (mL/kg/hr) 4000 (1.4)    Drains 260 (0.1)    Stool 0 (0)    Blood     Total Output 4260     Net -3560          Stool Occurrence 1 x      Labs Results for Mathew Norman, Mathew Norman (MRN [id number]) as of 06/11/2015 07:57  Ref. Range 06/11/2015 04:55  Sodium Latest Ref Range: 135-145 mmol/L 138  Potassium Latest Ref Range: 3.5-5.1 mmol/L 3.5  Chloride Latest Ref Range: 101-111 mmol/L 104  CO2 Latest Ref Range: 22-32 mmol/L 26  BUN Latest Ref Range: 6-20 mg/dL 7  Creatinine Latest Ref Range: 0.61-1.24 mg/dL 1.13  Calcium Latest Ref Range: 8.9-10.3 mg/dL 9.1  EGFR (Non-African Amer.) Latest Ref Range: >60 mL/min >60  EGFR (African American) Latest Ref Range: >60 mL/min >60  Glucose Latest Ref Range: 65-99 mg/dL 95  Anion gap Latest Ref Range: 5-15  8  Phosphorus Latest Ref Range: 2.5-4.6 mg/dL 4.6  Albumin Latest Ref Range: 3.5-5.0 g/dL 2.9 (L)  CK Total Latest Ref Range: 49-397 U/L 213     Exam  Gen: resting comfortably in bed, NAD  Abd: soft, NTND Pelvis: dressing stable, drain patent  Ext:       Left Lower Extremity   VAC functioning  Motor and sensory functions  intact  Ext warm  + DP pulse    Assessment and Plan   POD/HD#: 2   19 y/o male with pelvic abscess and osteomyelitis   1. Pelvic abscess and osteomyelitis s/p serial I&D's and removal of plates                          Return to OR for another wash out of pelvis tomorrow               May need to vac wound to closure, secondary intention healing               Bed to chair as tolerated                         NWB L Leg                         WBAT R leg              Pt on daptomycin and meropenem   Appreciate ID   2. Pain management:  made some changes yesterday  Pt tolerating better  Continue with current regimen   3. ABL anemia/Hemodynamics             Stable  4. Medical issues               Pre-diabetes                        dietary mods                Acute renal insufficiency                        improved    Ordered renal U/S              Iron Deficiency                           Pt with low iron and iron stores                         Given presence of active infection will hold on supplementation                         Recheck iron/ferritn/transferrin levels after infection cleared    Vitamin D and testosterone deficiency    Supplement vitamin d   Recheck in 12 weeks or so   5. DVT/PE prophylaxis:            lovenox   6. ID:               See #1             meropenem and daptomycin               Pelvic abscess cultures finally show some peptostreptococcus    New cultures without any growth    7. FEN/Foley/Lines:             npo after MN                8. Dispo:                         OR tomorrow for repeat I&D    Jari Pigg, PA-C [facility] [phone] (O) 06/11/2015 7:56 AM
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
## 2015-06-12 ENCOUNTER — Inpatient Hospital Stay (HOSPITAL_COMMUNITY): Payer: Commercial Managed Care - HMO | Admitting: Anesthesiology

## 2015-06-12 ENCOUNTER — Encounter (HOSPITAL_COMMUNITY)
Admission: EM | Disposition: A | Discharge status: Home / Self Care | Payer: Commercial Managed Care - HMO | Source: Home / Self Care | Attending: Orthopedic Surgery

## 2015-06-12 HISTORY — PX: INCISION AND DRAINAGE OF WOUND: SHX1803

## 2015-06-12 LAB — CBC
HEMATOCRIT: 26.1 % — AB (ref 39.0–52.0)
Hemoglobin: 8.2 g/dL — ABNORMAL LOW (ref 13.0–17.0)
MCH: 26.2 pg (ref 26.0–34.0)
MCHC: 31.4 g/dL (ref 30.0–36.0)
MCV: 83.4 fL (ref 78.0–100.0)
PLATELETS: 382 10*3/uL (ref 150–400)
RBC: 3.13 MIL/uL — ABNORMAL LOW (ref 4.22–5.81)
RDW: 13.1 % (ref 11.5–15.5)
WBC: 5.1 10*3/uL (ref 4.0–10.5)

## 2015-06-12 LAB — RENAL FUNCTION PANEL
Albumin: 2.9 g/dL — ABNORMAL LOW (ref 3.5–5.0)
Anion gap: 8 (ref 5–15)
BUN: 5 mg/dL — ABNORMAL LOW (ref 6–20)
CHLORIDE: 104 mmol/L (ref 101–111)
CO2: 27 mmol/L (ref 22–32)
CREATININE: 1.04 mg/dL (ref 0.61–1.24)
Calcium: 9.1 mg/dL (ref 8.9–10.3)
Glucose, Bld: 98 mg/dL (ref 65–99)
Phosphorus: 3.9 mg/dL (ref 2.5–4.6)
Potassium: 3.4 mmol/L — ABNORMAL LOW (ref 3.5–5.1)
Sodium: 139 mmol/L (ref 135–145)

## 2015-06-12 LAB — GLUCOSE, CAPILLARY
GLUCOSE-CAPILLARY: 114 mg/dL — AB (ref 65–99)
GLUCOSE-CAPILLARY: 157 mg/dL — AB (ref 65–99)
Glucose-Capillary: 96 mg/dL (ref 65–99)
Glucose-Capillary: 86 mg/dL (ref 65–99)

## 2015-06-12 LAB — WOUND CULTURE: Culture: NO GROWTH

## 2015-06-12 LAB — ANAEROBIC CULTURE

## 2015-06-12 SURGERY — IRRIGATION AND DEBRIDEMENT WOUND
Anesthesia: General | Site: Pelvis

## 2015-06-12 MED ORDER — MIDAZOLAM HCL 2 MG/2ML IJ SOLN
INTRAMUSCULAR | Status: AC
  Filled 2015-06-12: qty 4

## 2015-06-12 MED ORDER — PROPOFOL 10 MG/ML IV BOLUS
INTRAVENOUS | Status: DC | PRN
  Administered 2015-06-12: 200 mg via INTRAVENOUS

## 2015-06-12 MED ORDER — HYDROMORPHONE HCL 1 MG/ML IJ SOLN
INTRAMUSCULAR | Status: AC
  Filled 2015-06-12: qty 2

## 2015-06-12 MED ORDER — LIDOCAINE HCL (CARDIAC) 20 MG/ML IV SOLN
INTRAVENOUS | Status: DC | PRN
  Administered 2015-06-12: 100 mg via INTRAVENOUS

## 2015-06-12 MED ORDER — ROCURONIUM BROMIDE 100 MG/10ML IV SOLN
INTRAVENOUS | Status: DC | PRN
  Administered 2015-06-12: 25 mg via INTRAVENOUS

## 2015-06-12 MED ORDER — FENTANYL CITRATE (PF) 250 MCG/5ML IJ SOLN
INTRAMUSCULAR | Status: AC
  Filled 2015-06-12: qty 5

## 2015-06-12 MED ORDER — NEOSTIGMINE METHYLSULFATE 10 MG/10ML IV SOLN
INTRAVENOUS | Status: DC | PRN
  Administered 2015-06-12: 1.5 mg via INTRAVENOUS

## 2015-06-12 MED ORDER — HYDROMORPHONE HCL 1 MG/ML IJ SOLN
INTRAMUSCULAR | Status: AC
  Filled 2015-06-12: qty 1

## 2015-06-12 MED ORDER — OXYCODONE HCL 5 MG PO TABS: 5.0000 mg | ORAL_TABLET | Freq: Once | ORAL | Status: DC | PRN

## 2015-06-12 MED ORDER — SUCCINYLCHOLINE CHLORIDE 20 MG/ML IJ SOLN
INTRAMUSCULAR | Status: DC | PRN
  Administered 2015-06-12: 120 mg via INTRAVENOUS

## 2015-06-12 MED ORDER — GLYCOPYRROLATE 0.2 MG/ML IJ SOLN
INTRAMUSCULAR | Status: DC | PRN
  Administered 2015-06-12: .2 mg via INTRAVENOUS

## 2015-06-12 MED ORDER — MIDAZOLAM HCL 5 MG/5ML IJ SOLN
INTRAMUSCULAR | Status: DC | PRN
  Administered 2015-06-12: 2 mg via INTRAVENOUS

## 2015-06-12 MED ORDER — HYDROMORPHONE HCL 1 MG/ML IJ SOLN
1.0000 mg | Freq: Once | INTRAMUSCULAR | Status: AC
  Administered 2015-06-12: 1 mg via INTRAVENOUS

## 2015-06-12 MED ORDER — ONDANSETRON HCL 4 MG/2ML IJ SOLN: 4.0000 mg | Freq: Four times a day (QID) | INTRAMUSCULAR | Status: DC | PRN

## 2015-06-12 MED ORDER — OXYCODONE HCL 5 MG/5ML PO SOLN: 5.0000 mg | Freq: Once | ORAL | Status: DC | PRN

## 2015-06-12 MED ORDER — HYDROMORPHONE HCL 1 MG/ML IJ SOLN
0.2500 mg | INTRAMUSCULAR | Status: DC | PRN
  Administered 2015-06-12 (×4): 0.5 mg via INTRAVENOUS

## 2015-06-12 MED ORDER — FENTANYL CITRATE (PF) 100 MCG/2ML IJ SOLN
INTRAMUSCULAR | Status: DC | PRN
  Administered 2015-06-12 (×3): 50 ug via INTRAVENOUS
  Administered 2015-06-12: 100 ug via INTRAVENOUS
  Administered 2015-06-12: 50 ug via INTRAVENOUS
  Administered 2015-06-12: 150 ug via INTRAVENOUS
  Administered 2015-06-12: 50 ug via INTRAVENOUS

## 2015-06-12 MED ORDER — SODIUM CHLORIDE 0.9 % IR SOLN
Status: DC | PRN
  Administered 2015-06-12: 2000 mL

## 2015-06-12 SURGICAL SUPPLY — 58 items
BRUSH FEMORAL CANAL (MISCELLANEOUS) IMPLANT
BRUSH SCRUB DISP (MISCELLANEOUS) ×6 IMPLANT
CONNECTOR Y ATS VAC SYSTEM (MISCELLANEOUS) ×2 IMPLANT
COVER SURGICAL LIGHT HANDLE (MISCELLANEOUS) ×4 IMPLANT
DRAPE C-ARMOR (DRAPES) ×1 IMPLANT
DRAPE IMP U-DRAPE 54X76 (DRAPES) ×1 IMPLANT
DRAPE INCISE IOBAN 66X45 STRL (DRAPES) IMPLANT
DRAPE ORTHO SPLIT 77X108 STRL (DRAPES) ×6
DRAPE SURG ORHT 6 SPLT 77X108 (DRAPES) ×2 IMPLANT
DRAPE U-SHAPE 47X51 STRL (DRAPES) ×1 IMPLANT
DRSG MEPITEL 4X7.2 (GAUZE/BANDAGES/DRESSINGS) ×2 IMPLANT
DRSG PAD ABDOMINAL 8X10 ST (GAUZE/BANDAGES/DRESSINGS) ×1 IMPLANT
DRSG VAC ATS MED SENSATRAC (GAUZE/BANDAGES/DRESSINGS) ×2 IMPLANT
DRSG VERSA FOAM LRG 10X15 (GAUZE/BANDAGES/DRESSINGS) ×2 IMPLANT
ELECT BLADE 6.5 EXT (BLADE) ×1 IMPLANT
ELECT CAUTERY BLADE 6.4 (BLADE) ×1 IMPLANT
ELECT REM PT RETURN 9FT ADLT (ELECTROSURGICAL) ×3
ELECTRODE REM PT RTRN 9FT ADLT (ELECTROSURGICAL) ×1 IMPLANT
EVACUATOR 1/8 PVC DRAIN (DRAIN) IMPLANT
GAUZE SPONGE 4X4 12PLY STRL (GAUZE/BANDAGES/DRESSINGS) ×1 IMPLANT
GAUZE XEROFORM 5X9 LF (GAUZE/BANDAGES/DRESSINGS) ×1 IMPLANT
GLOVE BIO SURGEON STRL SZ7.5 (GLOVE) ×3 IMPLANT
GLOVE BIO SURGEON STRL SZ8 (GLOVE) ×3 IMPLANT
GLOVE BIOGEL PI IND STRL 7.5 (GLOVE) ×1 IMPLANT
GLOVE BIOGEL PI IND STRL 8 (GLOVE) ×1 IMPLANT
GLOVE BIOGEL PI INDICATOR 7.5 (GLOVE) ×2
GLOVE BIOGEL PI INDICATOR 8 (GLOVE) ×2
GOWN STRL REUS W/ TWL XL LVL3 (GOWN DISPOSABLE) ×1 IMPLANT
GOWN STRL REUS W/TWL 2XL LVL3 (GOWN DISPOSABLE) ×7 IMPLANT
GOWN STRL REUS W/TWL XL LVL3 (GOWN DISPOSABLE) ×3
HANDPIECE INTERPULSE COAX TIP (DISPOSABLE)
KIT ROOM TURNOVER OR (KITS) ×3 IMPLANT
MANIFOLD NEPTUNE II (INSTRUMENTS) ×3 IMPLANT
NEEDLE 22X1 1/2 (OR ONLY) (NEEDLE) IMPLANT
NS IRRIG 1000ML POUR BTL (IV SOLUTION) ×5 IMPLANT
PACK TOTAL JOINT (CUSTOM PROCEDURE TRAY) ×3 IMPLANT
PACK UNIVERSAL I (CUSTOM PROCEDURE TRAY) ×1 IMPLANT
PAD ARMBOARD 7.5X6 YLW CONV (MISCELLANEOUS) ×6 IMPLANT
PAD NEG PRESSURE SENSATRAC (MISCELLANEOUS) ×2 IMPLANT
PILLOW ABDUCTION HIP (SOFTGOODS) IMPLANT
SET HNDPC FAN SPRY TIP SCT (DISPOSABLE) IMPLANT
SPONGE LAP 18X18 X RAY DECT (DISPOSABLE) IMPLANT
STAPLER VISISTAT 35W (STAPLE) ×3 IMPLANT
SUT ETHILON 2 0 PSLX (SUTURE) ×2 IMPLANT
SUT ETHILON 3 0 PS 1 (SUTURE) ×2 IMPLANT
SUT PDS AB 2-0 CT1 27 (SUTURE) ×2 IMPLANT
SUT VIC AB 0 CT1 27 (SUTURE)
SUT VIC AB 0 CT1 27XBRD ANBCTR (SUTURE) ×2 IMPLANT
SUT VIC AB 1 CT1 27 (SUTURE)
SUT VIC AB 1 CT1 27XBRD ANBCTR (SUTURE) ×1 IMPLANT
SUT VIC AB 2-0 CT1 27 (SUTURE)
SUT VIC AB 2-0 CT1 TAPERPNT 27 (SUTURE) ×1 IMPLANT
SYR CONTROL 10ML LL (SYRINGE) ×1 IMPLANT
TOWEL OR 17X24 6PK STRL BLUE (TOWEL DISPOSABLE) ×3 IMPLANT
TOWEL OR 17X26 10 PK STRL BLUE (TOWEL DISPOSABLE) ×4 IMPLANT
TOWER CARTRIDGE SMART MIX (DISPOSABLE) IMPLANT
TRAY FOLEY CATH 16FRSI W/METER (SET/KITS/TRAYS/PACK) IMPLANT
WATER STERILE IRR 1000ML POUR (IV SOLUTION) ×4 IMPLANT

## 2015-06-12 NOTE — Transfer of Care (Signed)
Immediate Anesthesia Transfer of Care Note  Patient: Mathew Norman  Procedure(s) Performed: Procedure(s): IRRIGATION AND DEBRIDEMENT PELVIS AND LEFT THIGH, WOUND VAC CHANGE (N/A)  Patient Location: PACU  Anesthesia Type:General  Level of Consciousness: awake, alert  and oriented  Airway & Oxygen Therapy: Patient Spontanous Breathing and Patient connected to nasal cannula oxygen  Post-op Assessment: Report given to RN and Post -op Vital signs reviewed and stable  Post vital signs: Reviewed and stable  Last Vitals:  Filed Vitals:   06/12/15 1256  BP: 162/88  Pulse: 88  Temp: 36.9 C  Resp: 18    Complications: No apparent anesthesia complications

## 2015-06-12 NOTE — Progress Notes (Signed)
PT Cancellation Note  Patient Details Name: ANSEL FERRALL MRN: [id number] DOB: 02/01/1996   Cancelled Treatment:    Reason Eval/Treat Not Completed: Medical issues which prohibited therapy. Pt is NPO this AM with plan to return to OR for repeat I&D.   Ilda Foil 06/12/2015, 9:24 AM

## 2015-06-12 NOTE — Anesthesia Procedure Notes (Signed)
Procedure Name: Intubation Date/Time: 06/12/2015 1:48 PM Performed by: Yvonne Kendall S Patient Re-evaluated:Patient Re-evaluated prior to inductionOxygen Delivery Method: Circle system utilized Preoxygenation: Pre-oxygenation with 100% oxygen Intubation Type: IV induction Laryngoscope Size: Miller and 2 Grade View: Grade I Tube type: Oral Tube size: 7.5 mm Number of attempts: 1 Placement Confirmation: ETT inserted through vocal cords under direct vision,  positive ETCO2 and breath sounds checked- equal and bilateral Tube secured with: Tape Dental Injury: Teeth and Oropharynx as per pre-operative assessment

## 2015-06-12 NOTE — Progress Notes (Signed)
In Pre-op.  Feeling better. Tired of surgery!  No change in EXAM.  I discussed with the patient the risks and benefits of surgery for repeat debridement and progression toward closure, including the possibility of recurrent infection, nerve injury, vessel injury, wound breakdown, DVT/ PE, loss of motion, and need for further surgery among others.  He understood these risks and wished to proceed.  Myrene Galas, MD [facility] [phone] (p)

## 2015-06-12 NOTE — Brief Op Note (Signed)
05/26/2015 - 06/12/2015  2:35 PM  PATIENT:  Mathew Norman  18 y.o. male  PRE-OPERATIVE DIAGNOSIS:  PELVIC ABSCESS, LEFT THIGH ABSCESS  POST-OPERATIVE DIAGNOSIS:  PELVIC ABSCESS, LEFT THIGH ABSCESS  PROCEDURE:  Procedure(s): 1. IRRIGATION AND DEBRIDEMENT PELVIS 2. IRRIGATION AND DEBRIDEMENT LEFT THIGH 3. PELVIC WOUND VAC (N/A), LARGE 4. INCISIONAL WOUND VAC LEFT THIGH  SURGEON:  Surgeon(s) and Role:    * Myrene Galas, MD - Primary  PHYSICIAN ASSISTANT: [name], PA-C  ANESTHESIA:   general  I/O:     SPECIMEN:  No Specimen  TOURNIQUET:  * No tourniquets in log *  DICTATION: .Other Dictation: Dictation Number [id number]

## 2015-06-12 NOTE — Anesthesia Postprocedure Evaluation (Signed)
  Anesthesia Post-op Note  Patient: Mathew Norman  Procedure(s) Performed: Procedure(s): IRRIGATION AND DEBRIDEMENT PELVIS AND LEFT THIGH, WOUND VAC CHANGE (N/A)  Patient Location: PACU  Anesthesia Type:General  Level of Consciousness: awake and alert   Airway and Oxygen Therapy: Patient Spontanous Breathing  Post-op Pain: Controlled  Post-op Assessment: Post-op Vital signs reviewed, Patient's Cardiovascular Status Stable and Respiratory Function Stable  Post-op Vital Signs: Reviewed  Filed Vitals:   06/12/15 1518  BP: 156/96  Pulse: 70  Temp:   Resp: 14    Complications: No apparent anesthesia complications

## 2015-06-13 ENCOUNTER — Encounter (HOSPITAL_COMMUNITY): Payer: Self-pay | Admitting: Orthopedic Surgery

## 2015-06-13 LAB — TISSUE CULTURE: CULTURE: NO GROWTH

## 2015-06-13 LAB — CULTURE, ROUTINE-ABSCESS: CULTURE: NO GROWTH

## 2015-06-13 LAB — GLUCOSE, CAPILLARY
GLUCOSE-CAPILLARY: 117 mg/dL — AB (ref 65–99)
GLUCOSE-CAPILLARY: 129 mg/dL — AB (ref 65–99)
Glucose-Capillary: 87 mg/dL (ref 65–99)
Glucose-Capillary: 106 mg/dL — ABNORMAL HIGH (ref 65–99)

## 2015-06-13 MED ORDER — DEXTROSE 5 % IV SOLN
2.0000 g | INTRAVENOUS | Status: DC
  Administered 2015-06-13 – 2015-06-16 (×4): 2 g via INTRAVENOUS
  Filled 2015-06-13 (×5): qty 2

## 2015-06-13 NOTE — Progress Notes (Signed)
[facility] Progress Note  Subjective  Doing ok No specific complaints Pain improved   Review of Systems  Constitutional: Negative for fever and chills.  Respiratory: Negative for shortness of breath and wheezing.   Cardiovascular: Negative for chest pain and palpitations.  Gastrointestinal: Negative for nausea, vomiting and abdominal pain.  Genitourinary: Negative for dysuria.  Neurological: Negative for tingling, sensory change and headaches.     Objective   BP 143/97 mmHg  Pulse 83  Temp(Src) 98.4 F (36.9 C) (Oral)  Resp 16  Ht  (1.676 m)  Wt 117.028 kg (258 lb)  BMI 41.66 kg/m2  SpO2 100%  Intake/Output      09/29 0701 - 09/30 0700 09/30 0701 - 10/01 0700   P.O. 360    I.V. (mL/kg) 1325 (11.3)    IV Piggyback 200    Total Intake(mL/kg) 1885 (16.1)    Urine (mL/kg/hr) 5000 (1.8)    Drains 100 (0)    Total Output 5100     Net -3215            Labs  CBG (last 3)   Recent Labs  06/12/15 1731 06/12/15 2110 06/13/15 0801  GLUCAP 86 157* 117*      Exam  Gen: resting comfortably in bed, NAD   Abd: soft, NTND Pelvis: dressing stable, vac functioning well    Ext:        Left Lower Extremity               VAC functioning             Motor and sensory functions intact             Ext warm             + DP pulse     Assessment and Plan   POD/HD#: 1   19 y/o male with L thigh soft tissue abscess, pelvic abscess and osteomyelitis   1. Thigh abscess, Pelvic abscess and osteomyelitis s/p serial I&D's and removal of plates                           Return to OR for another wash out of pelvis Monday or tues               soft tissue much improved                Bed to chair as tolerated                         NWB L Leg                         WBAT R leg                         Pt on daptomycin and meropenem               Appreciate ID   2. Pain management:                         Pt tolerating better  Continue with current regimen   3. ABL anemia/Hemodynamics             Stable  4. Medical issues               Pre-diabetes  dietary mods                Acute renal insufficiency                        improved                           Ordered renal U/S still waiting for U/S to be performed               Iron Deficiency                           Pt with low iron and iron stores                         Given presence of active infection will hold on supplementation                         Recheck iron/ferritn/transferrin levels after infection cleared               Vitamin D and testosterone deficiency                           Supplement vitamin d                         Recheck in 12 weeks or so   5. DVT/PE prophylaxis:            lovenox   6. ID:               See #1             meropenem and daptomycin               Pelvic abscess cultures finally show some peptostreptococcus                    New cultures without any growth    7. FEN/Foley/Lines:             CHO mod diet                8. Dispo:                       OR Monday or tues of next week     Mearl Latin, PA-C [facility] [phone] (O) 06/13/2015 8:43 AM
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
## 2015-06-13 NOTE — Op Note (Signed)
NAMEMarland Kitchen  DAMONTRE, MILLEA NO.:  [id number]  MEDICAL RECORD NO.:  [id number]  LOCATION:  6N29C                        FACILITY:  [facility]  PHYSICIAN:  Doralee Albino. Carola Frost, M.D. DATE OF BIRTH:  September 16, 1995  DATE OF PROCEDURE:  06/12/2015 DATE OF DISCHARGE:                              OPERATIVE REPORT   PREOPERATIVE DIAGNOSES: 1. Pelvic abscess. 2. Left thigh abscess.  POSTOPERATIVE DIAGNOSES: 1. Pelvic abscess. 2. Left thigh abscess.  PROCEDURES: 1. Irrigation and debridement of pelvis with curettage. 2. Irrigation and debridement of left thigh with layered closure. 3. Application of large wound VAC pelvis. 4. Application of left thigh incisional wound VAC, small.  SURGEON:  Doralee Albino. Carola Frost, M.D.  ASSISTANT:  Mearl Latin, PA-C.  ANESTHESIA:  General.  COMPLICATIONS:  None  SPECIMENS:  None.  DISPOSITION:  To PACU.  CONDITION:  Stable.  BRIEF SUMMARY AND INDICATION FOR PROCEDURE:  Nadir Vasques is an 19 year old, well-known from multiple surgeries related to the operative repair and subsequently infection of his pelvis and newer onset left thigh abscess for which he has undergone incision, drainage, and excisional debridements.  He now returned to the OR and progression toward closure if at all feasible of his wounds.  I discussed with the patient risks and benefits of surgery including the possibility of persistent infection, need for further surgery, which is certainly anticipated, anesthetic complications of DVT, PE, and multiple others.  The patient acknowledged these risks and did wish to proceed.  BRIEF SUMMARY OF PROCEDURE:  Mr. Maultsby was taken to the operating room where general anesthesia was induced.  Time-out was held after a sterile prep and drape.  The abdomen had no evidence of purulence.  There were few areas of white tissue that required curettage along the bone in previous plate track.  We also did go deep toward the  quadrilateral plate and thoroughly irrigated this area.  I did not identify any purulence or collection whatsoever.  Ultimately, a combination of white foam and black foam was placed in order to get closure of this area with large wound VAC.  The left femur was evaluated as well and found to have no pockets of purulence.  There were no deep extravasations and healthy granulation tissue abounded.  It was irrigated thoroughly and then closed using retention suture technique with 2-0 nylon and again widely displaced vertical mattress.  Mepitel layer was placed in a small wound VAC over this area.  [name], PA-C assisted me throughout, so we were able to work simultaneously on the wounds.  There were no complications.  This patient was awakened from anesthesia and transported to the PACU in stable condition.  PROGNOSIS:  Mr. Hakim will return the OR in another 3 days for further progression of his closure and probable wound VAC at that time.  He can mobilize bed-to-chair and weightbearing will be increased after his next procedure.     Doralee Albino. Carola Frost, M.D.     MHH/MEDQ  D:  06/12/2015  T:  06/13/2015  Job:  [id number]

## 2015-06-13 NOTE — Progress Notes (Signed)
Pharmacy Antibiotic  Note  Mathew Norman is a 19 y.o. year-old male admitted on 05/26/2015.  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   Recent Labs Lab 06/08/15 0457 06/10/15 0032 06/12/15 0545  WBC 5.6 6.4 5.1    Recent Labs Lab 06/07/15 0520 06/08/15 0457 06/09/15 1840 06/11/15 0455 06/12/15 0545  CREATININE 1.78* 1.64* 1.39* 1.13 1.04   Estimated Creatinine Clearance: 138.7 mL/min (by C-G formula based on Cr of 1.04). Tmax/24h 98.4  Antimicrobial allergies: NKDA  Antimicrobials this admission: Vanc 9/14 >> 9/19   Rifampin 9/14 >>9/27 Zosyn 9/14 >> 9/15 Ceftriaxone 9/19 >> 9/27  Resumed 9/30>> Meropenem 9/27 >> Daptomycin 9/27 >>    **9/28 CK = 213 (post 1 dose of Cubicin)  Levels/dose changes this admission:  9/15 VT = 18 on 1g q8 9/16 VR 31 mcg/mL, ~12 hrs after last dose 9/17 VR = 13 mcg/mL >> start  q24  Microbiology Results:  9/12 BCx x2 - negative 9/13 pelvis wound cx - mopderate Peptostreptococcus species 9/14 UCx - Proteus 30K c/mL (sensitive to CTX, Cipro, Primaxin, Septra, Bactrim) 9/23 abd abscess cx (anaerobic) - No growth final 9/23 abd abscess cx - negative 9/25 left thigh wound cx - negative 9/26 abd wound - NGTD 9/26 left leg tissue cx - NG final 9/26: left leg abscess - NG final   Assessment:  Antibiotics were broadened from ceftriaxone + rifampin to daptomycin + meropenem on 9/27 for concerns of resistant organisms not covered by ceftriaxone.  Operative cultures have been no growth.  After discussion with Pervis Hocking, MD, meropenem and daptomycin will be changed to ceftriaxone as none of the recent cultures since 9/27 have been positive.  Ceftriaxone is adequate therapy for peptostreptococcus which is the only positive culture so far.  Plan: Stop daptomycin and meropenem. Ceftriaxone 2g IV daily.  Thank you for allowing pharmacy to be a part of this patient's care.  Mickeal Skinner  PharmD 06/13/2015 4:01 PM

## 2015-06-14 DIAGNOSIS — N739 Female pelvic inflammatory disease, unspecified: Secondary | ICD-10-CM | POA: Insufficient documentation

## 2015-06-14 DIAGNOSIS — B954 Other streptococcus as the cause of diseases classified elsewhere: Secondary | ICD-10-CM

## 2015-06-14 DIAGNOSIS — T8149XA Infection following a procedure, other surgical site, initial encounter: Secondary | ICD-10-CM | POA: Insufficient documentation

## 2015-06-14 DIAGNOSIS — K651 Peritoneal abscess: Secondary | ICD-10-CM | POA: Insufficient documentation

## 2015-06-14 DIAGNOSIS — A491 Streptococcal infection, unspecified site: Secondary | ICD-10-CM | POA: Insufficient documentation

## 2015-06-14 LAB — ANAEROBIC CULTURE

## 2015-06-14 LAB — GLUCOSE, CAPILLARY
GLUCOSE-CAPILLARY: 114 mg/dL — AB (ref 65–99)
Glucose-Capillary: 98 mg/dL (ref 65–99)
Glucose-Capillary: 107 mg/dL — ABNORMAL HIGH (ref 65–99)
Glucose-Capillary: 95 mg/dL (ref 65–99)

## 2015-06-14 MED ORDER — METRONIDAZOLE 500 MG PO TABS
500.0000 mg | ORAL_TABLET | Freq: Four times a day (QID) | ORAL | Status: DC
  Administered 2015-06-14 – 2015-06-16 (×3): 500 mg via ORAL
  Filled 2015-06-14 (×3): qty 1

## 2015-06-14 NOTE — Progress Notes (Signed)
Subjective: 2 Days Post-Op Procedure(s) (LRB): IRRIGATION AND DEBRIDEMENT PELVIS AND LEFT THIGH, WOUND VAC CHANGE (N/A) Patient resting comfortably in room.  Objective: Vital signs in last 24 hours: Temp:  [98.3 F (36.8 C)-98.6 F (37 C)] 98.4 F (36.9 C) (10/01 0445) Pulse Rate:  [88-92] 88 (10/01 0445) Resp:  [16-20] 20 (10/01 0445) BP: (136-142)/(72-88) 136/84 mmHg (10/01 0445) SpO2:  [100 %] 100 % (10/01 0445)  Intake/Output from previous day: 09/30 0701 - 10/01 0700 In: 1250 [P.O.:1200; IV Piggyback:50] Out: 2950 [Urine:2850; Drains:100] Intake/Output this shift: Total I/O In: 240 [P.O.:240] Out: 400 [Urine:400]   Recent Labs  06/12/15 0545  HGB 8.2*    Recent Labs  06/12/15 0545  WBC 5.1  RBC 3.13*  HCT 26.1*  PLT 382    Recent Labs  06/12/15 0545  NA 139  K 3.4*  CL 104  CO2 27  BUN 5*  CREATININE 1.04  GLUCOSE 98  CALCIUM 9.1   No results for input(s): LABPT, INR in the last 72 hours.  Incision: dressing C/D/I  Assessment/Plan: 2 Days Post-Op Procedure(s) (LRB): IRRIGATION AND DEBRIDEMENT PELVIS AND LEFT THIGH, WOUND VAC CHANGE (N/A)    19 y/o male with L thigh soft tissue abscess, pelvic abscess and osteomyelitis   1. Thigh abscess, Pelvic abscess and osteomyelitis s/p serial I&D's and removal of plates   Return to OR for another wash out of pelvis Monday or tues  soft tissue much improved   Bed to chair as tolerated NWB L Leg WBAT R leg  Pt on daptomycin and meropenem  Appreciate ID   2. Pain management:  Pt tolerating better Continue with current regimen   3. ABL anemia/Hemodynamics Stable  4. Medical issues  Pre-diabetes dietary mods    Acute renal  insufficiency improved  Ordered renal U/S still waiting for U/S to be performed   Iron Deficiency  Pt with low iron and iron stores Given presence of active infection will hold on supplementation Recheck iron/ferritn/transferrin levels after infection cleared   Vitamin D and testosterone deficiency  Supplement vitamin d Recheck in 12 weeks or so   5. DVT/PE prophylaxis: lovenox   6. ID:  See #1 meropenem and daptomycin  Pelvic abscess cultures finally show some peptostreptococcus  New cultures without any growth    7. FEN/Foley/Lines: CHO mod diet    8. Dispo:  OR Monday or tues of next week   Mathew Norman 06/14/2015, 11:49 AM
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
## 2015-06-15 LAB — GLUCOSE, CAPILLARY
GLUCOSE-CAPILLARY: 102 mg/dL — AB (ref 65–99)
Glucose-Capillary: 98 mg/dL (ref 65–99)
Glucose-Capillary: 118 mg/dL — ABNORMAL HIGH (ref 65–99)
Glucose-Capillary: 117 mg/dL — ABNORMAL HIGH (ref 65–99)

## 2015-06-15 NOTE — Progress Notes (Signed)
Subjective: 3 Days Post-Op Procedure(s) (LRB): IRRIGATION AND DEBRIDEMENT PELVIS AND LEFT THIGH, WOUND VAC CHANGE (N/A) Patient reports pain as mild.   Objective: Vital signs in last 24 hours: Temp:  [98 F (36.7 C)-98.6 F (37 C)] 98 F (36.7 C) (10/02 0604) Pulse Rate:  [73-92] 73 (10/02 0604) Resp:  [17-18] 18 (10/02 0604) BP: (151-155)/(95-103) 151/103 mmHg (10/02 0604) SpO2:  [97 %-100 %] 100 % (10/02 0604)  Intake/Output from previous day: 10/01 0701 - 10/02 0700 In: 760 [P.O.:600; I.V.:160] Out: 1350 [Urine:1350] Intake/Output this shift: Total I/O In: -  Out: 420 [Urine:420]  No results for input(s): HGB in the last 72 hours. No results for input(s): WBC, RBC, HCT, PLT in the last 72 hours. No results for input(s): NA, K, CL, CO2, BUN, CREATININE, GLUCOSE, CALCIUM in the last 72 hours. No results for input(s): LABPT, INR in the last 72 hours.  Neurovascular intact Sensation intact distally Intact pulses distally Dorsiflexion/Plantar flexion intact Incision: wound vacs pelvis and left thigh in place working appropriately   Assessment/Plan: 3 Days Post-Op Procedure(s) (LRB): IRRIGATION AND DEBRIDEMENT PELVIS AND LEFT THIGH, WOUND VAC CHANGE (N/A)  19 y/o male with L thigh soft tissue abscess, pelvic abscess and osteomyelitis  1. Thigh abscess, Pelvic abscess and osteomyelitis s/p serial I&D's and removal of plates  Return to OR for another wash out of pelvis Monday or tues  soft tissue much improved  Bed to chair as tolerated  NWB L Leg  WBAT R leg  Pt on daptomycin and meropenem  Appreciate ID  2. Pain management:  Pt tolerating better  Continue with current regimen  3. DVT/PE prophylaxis:  lovenox  4. ID:  See #1   Plan is for OR tomorrow repeat I&D pelvis and left thigh possible wound closure NPO after midnight      Mccayla Shimada 06/15/2015, 10:57 AM

## 2015-06-15 NOTE — Progress Notes (Signed)
Discharge disposition remains pending as patient is planned for OR tomorrow per Progress note repeat I&D pelvis and left thigh possible wound closure. CM remains available for ongoing discharge planning needs.

## 2015-06-16 ENCOUNTER — Inpatient Hospital Stay (HOSPITAL_COMMUNITY): Payer: Commercial Managed Care - HMO | Admitting: Certified Registered"

## 2015-06-16 ENCOUNTER — Encounter (HOSPITAL_COMMUNITY)
Admission: EM | Disposition: A | Discharge status: Home / Self Care | Payer: Self-pay | Source: Home / Self Care | Attending: Orthopedic Surgery

## 2015-06-16 HISTORY — PX: INCISION AND DRAINAGE OF WOUND: SHX1803

## 2015-06-16 LAB — GLUCOSE, CAPILLARY
GLUCOSE-CAPILLARY: 92 mg/dL (ref 65–99)
GLUCOSE-CAPILLARY: 93 mg/dL (ref 65–99)
Glucose-Capillary: 95 mg/dL (ref 65–99)
Glucose-Capillary: 86 mg/dL (ref 65–99)

## 2015-06-16 SURGERY — IRRIGATION AND DEBRIDEMENT WOUND
Anesthesia: General

## 2015-06-16 MED ORDER — SODIUM CHLORIDE 0.9 % IR SOLN
Status: DC | PRN
  Administered 2015-06-16: 2000 mL

## 2015-06-16 MED ORDER — PROPOFOL 10 MG/ML IV BOLUS
INTRAVENOUS | Status: AC
  Filled 2015-06-16: qty 20

## 2015-06-16 MED ORDER — NEOSTIGMINE METHYLSULFATE 10 MG/10ML IV SOLN
INTRAVENOUS | Status: DC | PRN
Start: 2015-06-16 — End: 2015-06-16
  Administered 2015-06-16: 5 mg via INTRAVENOUS

## 2015-06-16 MED ORDER — GLYCOPYRROLATE 0.2 MG/ML IJ SOLN
INTRAMUSCULAR | Status: DC | PRN
  Administered 2015-06-16: .8 mg via INTRAVENOUS

## 2015-06-16 MED ORDER — LIDOCAINE HCL (CARDIAC) 20 MG/ML IV SOLN
INTRAVENOUS | Status: DC | PRN
  Administered 2015-06-16: 80 mg via INTRAVENOUS

## 2015-06-16 MED ORDER — LACTATED RINGERS IV SOLN
INTRAVENOUS | Status: DC
  Administered 2015-06-16 – 2015-06-19 (×5): via INTRAVENOUS

## 2015-06-16 MED ORDER — FENTANYL CITRATE (PF) 100 MCG/2ML IJ SOLN
INTRAMUSCULAR | Status: DC | PRN
  Administered 2015-06-16 (×3): 50 ug via INTRAVENOUS
  Administered 2015-06-16: 100 ug via INTRAVENOUS

## 2015-06-16 MED ORDER — PROPOFOL 10 MG/ML IV BOLUS
INTRAVENOUS | Status: DC | PRN
  Administered 2015-06-16: 200 mg via INTRAVENOUS

## 2015-06-16 MED ORDER — MIDAZOLAM HCL 5 MG/5ML IJ SOLN
INTRAMUSCULAR | Status: DC | PRN
  Administered 2015-06-16: 2 mg via INTRAVENOUS

## 2015-06-16 MED ORDER — ARTIFICIAL TEARS OP OINT
TOPICAL_OINTMENT | OPHTHALMIC | Status: DC | PRN
  Administered 2015-06-16: 1 via OPHTHALMIC

## 2015-06-16 MED ORDER — HYDROMORPHONE HCL 1 MG/ML IJ SOLN
INTRAMUSCULAR | Status: AC
  Administered 2015-06-16: 14:00:00
  Filled 2015-06-16: qty 1

## 2015-06-16 MED ORDER — FENTANYL CITRATE (PF) 100 MCG/2ML IJ SOLN
INTRAMUSCULAR | Status: AC
  Administered 2015-06-16: 100 ug
  Filled 2015-06-16: qty 2

## 2015-06-16 MED ORDER — ONDANSETRON HCL 4 MG/2ML IJ SOLN
INTRAMUSCULAR | Status: DC | PRN
  Administered 2015-06-16: 4 mg via INTRAVENOUS

## 2015-06-16 MED ORDER — FENTANYL CITRATE (PF) 250 MCG/5ML IJ SOLN
INTRAMUSCULAR | Status: AC
  Filled 2015-06-16: qty 5

## 2015-06-16 MED ORDER — HYDROMORPHONE HCL 1 MG/ML IJ SOLN
0.2500 mg | INTRAMUSCULAR | Status: DC | PRN
  Administered 2015-06-16 (×4): 0.5 mg via INTRAVENOUS

## 2015-06-16 MED ORDER — ONDANSETRON HCL 4 MG/2ML IJ SOLN: 4.0000 mg | Freq: Once | INTRAMUSCULAR | Status: DC | PRN

## 2015-06-16 MED ORDER — LACTATED RINGERS IV SOLN
INTRAVENOUS | Status: DC | PRN
  Administered 2015-06-16: 13:00:00 via INTRAVENOUS

## 2015-06-16 MED ORDER — FENTANYL CITRATE (PF) 100 MCG/2ML IJ SOLN
100.0000 ug | Freq: Once | INTRAMUSCULAR | Status: AC
  Administered 2015-06-16: 100 ug via INTRAVENOUS

## 2015-06-16 MED ORDER — ROCURONIUM BROMIDE 100 MG/10ML IV SOLN
INTRAVENOUS | Status: DC | PRN
  Administered 2015-06-16: 50 mg via INTRAVENOUS

## 2015-06-16 MED ORDER — HYDROMORPHONE HCL 1 MG/ML IJ SOLN
INTRAMUSCULAR | Status: AC
  Administered 2015-06-16: 15:00:00
  Filled 2015-06-16: qty 1

## 2015-06-16 MED ORDER — MIDAZOLAM HCL 2 MG/2ML IJ SOLN
INTRAMUSCULAR | Status: AC
  Filled 2015-06-16: qty 4

## 2015-06-16 SURGICAL SUPPLY — 56 items
BRUSH FEMORAL CANAL (MISCELLANEOUS) IMPLANT
BRUSH SCRUB DISP (MISCELLANEOUS) ×6 IMPLANT
CANISTER WOUND CARE 500ML ATS (WOUND CARE) ×2 IMPLANT
COVER SURGICAL LIGHT HANDLE (MISCELLANEOUS) ×6 IMPLANT
DRAPE C-ARMOR (DRAPES) ×3 IMPLANT
DRAPE IMP U-DRAPE 54X76 (DRAPES) ×3 IMPLANT
DRAPE INCISE IOBAN 66X45 STRL (DRAPES) IMPLANT
DRAPE ORTHO SPLIT 77X108 STRL (DRAPES) ×6
DRAPE SURG ORHT 6 SPLT 77X108 (DRAPES) ×2 IMPLANT
DRAPE U-SHAPE 47X51 STRL (DRAPES) ×3 IMPLANT
DRSG PAD ABDOMINAL 8X10 ST (GAUZE/BANDAGES/DRESSINGS) ×3 IMPLANT
DRSG VAC ATS MED SENSATRAC (GAUZE/BANDAGES/DRESSINGS) ×2 IMPLANT
DRSG VERSA FOAM LRG 10X15 (GAUZE/BANDAGES/DRESSINGS) ×2 IMPLANT
ELECT BLADE 6.5 EXT (BLADE) ×3 IMPLANT
ELECT CAUTERY BLADE 6.4 (BLADE) ×3 IMPLANT
ELECT REM PT RETURN 9FT ADLT (ELECTROSURGICAL) ×3
ELECTRODE REM PT RTRN 9FT ADLT (ELECTROSURGICAL) ×1 IMPLANT
EVACUATOR 1/8 PVC DRAIN (DRAIN) IMPLANT
GAUZE SPONGE 4X4 12PLY STRL (GAUZE/BANDAGES/DRESSINGS) ×3 IMPLANT
GAUZE XEROFORM 5X9 LF (GAUZE/BANDAGES/DRESSINGS) ×3 IMPLANT
GLOVE BIO SURGEON STRL SZ7.5 (GLOVE) ×3 IMPLANT
GLOVE BIO SURGEON STRL SZ8 (GLOVE) ×3 IMPLANT
GLOVE BIOGEL PI IND STRL 7.5 (GLOVE) ×1 IMPLANT
GLOVE BIOGEL PI IND STRL 8 (GLOVE) ×1 IMPLANT
GLOVE BIOGEL PI INDICATOR 7.5 (GLOVE) ×2
GLOVE BIOGEL PI INDICATOR 8 (GLOVE) ×2
GOWN STRL REUS W/ TWL XL LVL3 (GOWN DISPOSABLE) ×1 IMPLANT
GOWN STRL REUS W/TWL 2XL LVL3 (GOWN DISPOSABLE) ×9 IMPLANT
GOWN STRL REUS W/TWL XL LVL3 (GOWN DISPOSABLE) ×3
HANDPIECE INTERPULSE COAX TIP (DISPOSABLE)
KIT ROOM TURNOVER OR (KITS) ×3 IMPLANT
MANIFOLD NEPTUNE II (INSTRUMENTS) ×3 IMPLANT
NEEDLE 22X1 1/2 (OR ONLY) (NEEDLE) IMPLANT
NS IRRIG 1000ML POUR BTL (IV SOLUTION) ×3 IMPLANT
PACK TOTAL JOINT (CUSTOM PROCEDURE TRAY) ×3 IMPLANT
PACK UNIVERSAL I (CUSTOM PROCEDURE TRAY) ×3 IMPLANT
PAD ARMBOARD 7.5X6 YLW CONV (MISCELLANEOUS) ×6 IMPLANT
PILLOW ABDUCTION HIP (SOFTGOODS) IMPLANT
SET HNDPC FAN SPRY TIP SCT (DISPOSABLE) IMPLANT
SPONGE LAP 18X18 X RAY DECT (DISPOSABLE) IMPLANT
STAPLER VISISTAT 35W (STAPLE) ×3 IMPLANT
SUT ETHILON 2 0 PSLX (SUTURE) ×2 IMPLANT
SUT PDS AB 0 CT 36 (SUTURE) ×2 IMPLANT
SUT VIC AB 0 CT1 27 (SUTURE) ×6
SUT VIC AB 0 CT1 27XBRD ANBCTR (SUTURE) ×2 IMPLANT
SUT VIC AB 1 CT1 27 (SUTURE) ×3
SUT VIC AB 1 CT1 27XBRD ANBCTR (SUTURE) ×1 IMPLANT
SUT VIC AB 2-0 CT1 27 (SUTURE) ×3
SUT VIC AB 2-0 CT1 TAPERPNT 27 (SUTURE) ×1 IMPLANT
SYR CONTROL 10ML LL (SYRINGE) ×3 IMPLANT
TAPE CLOTH SURG 4X10 WHT LF (GAUZE/BANDAGES/DRESSINGS) ×2 IMPLANT
TOWEL OR 17X24 6PK STRL BLUE (TOWEL DISPOSABLE) ×3 IMPLANT
TOWEL OR 17X26 10 PK STRL BLUE (TOWEL DISPOSABLE) ×6 IMPLANT
TOWER CARTRIDGE SMART MIX (DISPOSABLE) IMPLANT
TRAY FOLEY CATH 16FRSI W/METER (SET/KITS/TRAYS/PACK) IMPLANT
WATER STERILE IRR 1000ML POUR (IV SOLUTION) ×12 IMPLANT

## 2015-06-16 NOTE — Progress Notes (Signed)
Doing well today.   I discussed with the patient the risks and benefits of surgery, including the possibility of persistent infection, nerve injury, vessel injury, wound breakdown, arthritis, DVT/ PE, loss of motion, and need for further surgery among others.  He understood these risks and wished to proceed.   Myrene Galas, MD [facility] [phone] (p)

## 2015-06-16 NOTE — Anesthesia Postprocedure Evaluation (Signed)
  Anesthesia Post-op Note  Patient: Mathew Norman  Procedure(s) Performed: Procedure(s): IRRIGATION AND DEBRIDEMENT OF PELVIS WITH APPLICATION OF WOUND VAC (N/A)  Patient Location: PACU  Anesthesia Type:General  Level of Consciousness: awake, alert  and oriented  Airway and Oxygen Therapy: Patient Spontanous Breathing  Post-op Pain: mild  Post-op Assessment: Post-op Vital signs reviewed, Patient's Cardiovascular Status Stable, Respiratory Function Stable, Patent Airway and Pain level controlled LLE Motor Response: Purposeful movement LLE Sensation: Pain          Post-op Vital Signs: stable  Last Vitals:  Filed Vitals:   06/16/15 1446  BP: 155/94  Pulse: 61  Temp: 36.7 C  Resp: 16    Complications: No apparent anesthesia complications

## 2015-06-16 NOTE — Transfer of Care (Signed)
Immediate Anesthesia Transfer of Care Note  Patient: Mathew Norman  Procedure(s) Performed: Procedure(s): IRRIGATION AND DEBRIDEMENT OF PELVIS WITH APPLICATION OF WOUND VAC (N/A)  Patient Location: PACU  Anesthesia Type:General  Level of Consciousness: awake, alert  and oriented  Airway & Oxygen Therapy: Patient Spontanous Breathing and Patient connected to nasal cannula oxygen  Post-op Assessment: Report given to RN, Post -op Vital signs reviewed and stable and Patient moving all extremities X 4  Post vital signs: Reviewed and stable  Last Vitals:  Filed Vitals:   06/16/15 1205  BP:   Pulse: 82  Temp:   Resp: 24    Complications: No apparent anesthesia complications

## 2015-06-16 NOTE — Progress Notes (Signed)
PT Cancellation Note  Patient Details Name: Mathew Norman MRN: [id number] DOB: 1995-11-15   Cancelled Treatment:    Reason Eval/Treat Not Completed: Patient at procedure or test/unavailable pt in OR for a repeat I and D of pelvis and possible wound closure. Will follow up next available time.   [name] [name] 06/16/2015, 11:28 AM  Mylo Red, PT, DPT [phone]

## 2015-06-16 NOTE — Progress Notes (Signed)
Patient c/o severe pain 8/10 and requested pain medicine.  Order received and given.

## 2015-06-16 NOTE — Anesthesia Preprocedure Evaluation (Addendum)
Anesthesia Evaluation  Patient identified by MRN, date of birth, ID band Patient awake    Reviewed: Allergy & Precautions, NPO status , Patient's Chart, lab work & pertinent test results  Airway Mallampati: II  TM Distance: >3 FB Neck ROM: Full    Dental  (+) Teeth Intact, Dental Advisory Given   Pulmonary  breath sounds clear to auscultation        Cardiovascular Rhythm:Regular Rate:Normal     Neuro/Psych    GI/Hepatic   Endo/Other    Renal/GU      Musculoskeletal   Abdominal   Peds  Hematology   Anesthesia Other Findings   Reproductive/Obstetrics                             Anesthesia Physical Anesthesia Plan  ASA: II  Anesthesia Plan: General   Post-op Pain Management:    Induction: Intravenous  Airway Management Planned: Oral ETT  Additional Equipment:   Intra-op Plan:   Post-operative Plan:   Informed Consent: I have reviewed the patients History and Physical, chart, labs and discussed the procedure including the risks, benefits and alternatives for the proposed anesthesia with the patient or authorized representative who has indicated his/her understanding and acceptance.   Dental advisory given  Plan Discussed with: CRNA and Anesthesiologist  Anesthesia Plan Comments:         Anesthesia Quick Evaluation  

## 2015-06-16 NOTE — Anesthesia Procedure Notes (Signed)
Procedure Name: Intubation Date/Time: 06/16/2015 12:57 PM Performed by: Lanell Matar Pre-anesthesia Checklist: Patient identified, Timeout performed, Emergency Drugs available, Suction available and Patient being monitored Patient Re-evaluated:Patient Re-evaluated prior to inductionOxygen Delivery Method: Circle system utilized Preoxygenation: Pre-oxygenation with 100% oxygen Intubation Type: IV induction Ventilation: Mask ventilation without difficulty Laryngoscope Size: Miller and 2 Grade View: Grade I Tube type: Oral Tube size: 7.5 mm Number of attempts: 1 Airway Equipment and Method: Stylet Placement Confirmation: breath sounds checked- equal and bilateral,  ETT inserted through vocal cords under direct vision,  positive ETCO2 and CO2 detector Secured at: 23 cm Tube secured with: Tape Dental Injury: Teeth and Oropharynx as per pre-operative assessment

## 2015-06-17 ENCOUNTER — Encounter (HOSPITAL_COMMUNITY): Payer: Self-pay | Admitting: Orthopedic Surgery

## 2015-06-17 DIAGNOSIS — Z9889 Other specified postprocedural states: Secondary | ICD-10-CM

## 2015-06-17 DIAGNOSIS — R51 Headache: Secondary | ICD-10-CM

## 2015-06-17 LAB — GLUCOSE, CAPILLARY
GLUCOSE-CAPILLARY: 110 mg/dL — AB (ref 65–99)
GLUCOSE-CAPILLARY: 103 mg/dL — AB (ref 65–99)
GLUCOSE-CAPILLARY: 110 mg/dL — AB (ref 65–99)
Glucose-Capillary: 108 mg/dL — ABNORMAL HIGH (ref 65–99)
Glucose-Capillary: 116 mg/dL — ABNORMAL HIGH (ref 65–99)

## 2015-06-17 MED ORDER — SODIUM CHLORIDE 0.9 % IV SOLN: 1.0000 g | INTRAVENOUS | Status: DC

## 2015-06-17 MED ORDER — HYDROCODONE-ACETAMINOPHEN 10-325 MG PO TABS: 1.0000 | ORAL_TABLET | Freq: Four times a day (QID) | ORAL | Status: DC | PRN

## 2015-06-17 MED ORDER — ERTAPENEM SODIUM 1 G IJ SOLR
1.0000 g | INTRAMUSCULAR | Status: DC
  Filled 2015-06-17: qty 1

## 2015-06-17 MED ORDER — DEXTROSE 5 % IV SOLN: 2.0000 g | INTRAVENOUS | Status: DC

## 2015-06-17 MED ORDER — SODIUM CHLORIDE 0.9 % IV SOLN
1.0000 g | INTRAVENOUS | Status: DC
  Administered 2015-06-17 – 2015-06-19 (×3): 1 g via INTRAVENOUS
  Filled 2015-06-17 (×6): qty 1

## 2015-06-17 MED ORDER — TRAMADOL HCL 50 MG PO TABS: 50.0000 mg | ORAL_TABLET | Freq: Three times a day (TID) | ORAL | Status: DC | PRN
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
## 2015-06-17 NOTE — Progress Notes (Signed)
Patient refused to take Flagyl oral tablet and states, "It cause severe headaches.  I will ask my doctor tomorrow to change it".

## 2015-06-17 NOTE — Progress Notes (Addendum)
[facility] Progress Note  Subjective  Feeling better C/o h/a with flagyl administration No other complaints today   Review of Systems  Constitutional: Negative for fever and chills.  Respiratory: Negative for shortness of breath.   Cardiovascular: Negative for chest pain.  Gastrointestinal: Negative for nausea, vomiting and abdominal pain.  Genitourinary: Negative for dysuria.  Neurological: Positive for headaches (headache with flagyl ). Negative for tingling and sensory change.     Objective   BP 151/92 mmHg  Pulse 66  Temp(Src) 98.8 F (37.1 C) (Oral)  Resp 16  Ht 5' 6"  (1.676 m)  Wt 117.028 kg (258 lb)  BMI 41.66 kg/m2  SpO2 100%  Intake/Output      10/03 0701 - 10/04 0700 10/04 0701 - 10/05 0700   P.O. 600    I.V. (mL/kg) 1206.7 (10.3)    Total Intake(mL/kg) 1806.7 (15.4)    Urine (mL/kg/hr) 1750 (0.6)    Drains 75 (0)    Stool 0 (0)    Total Output 1825     Net -18.3          Stool Occurrence 1 x      Labs  Results for Mathew Norman (MRN [id number]) as of 06/17/2015 09:20  Ref. Range 06/12/2015 05:45  Sodium Latest Ref Range: 135-145 mmol/L 139  Potassium Latest Ref Range: 3.5-5.1 mmol/L 3.4 (L)  Chloride Latest Ref Range: 101-111 mmol/L 104  CO2 Latest Ref Range: 22-32 mmol/L 27  BUN Latest Ref Range: 6-20 mg/dL 5 (L)  Creatinine Latest Ref Range: 0.61-1.24 mg/dL 1.04  Calcium Latest Ref Range: 8.9-10.3 mg/dL 9.1  EGFR (Non-African Amer.) Latest Ref Range: >60 mL/min >60  EGFR (African American) Latest Ref Range: >60 mL/min >60  Glucose Latest Ref Range: 65-99 mg/dL 98  Anion gap Latest Ref Range: 5-15  8  Phosphorus Latest Ref Range: 2.5-4.6 mg/dL 3.9  Albumin Latest Ref Range: 3.5-5.0 g/dL 2.9 (L)  WBC Latest Ref Range: 4.0-10.5 K/uL 5.1  RBC Latest Ref Range: 4.22-5.81 MIL/uL 3.13 (L)  Hemoglobin Latest Ref Range: 13.0-17.0 g/dL 8.2 (L)  HCT Latest Ref Range: 39.0-52.0 % 26.1 (L)  MCV Latest Ref Range: 78.0-100.0 fL 83.4   MCH Latest Ref Range: 26.0-34.0 pg 26.2  MCHC Latest Ref Range: 30.0-36.0 g/dL 31.4  RDW Latest Ref Range: 11.5-15.5 % 13.1  Platelets Latest Ref Range: 150-400 K/uL 382    Exam  Gen: awake and alert, NAD  Pelvis: VAC stable  Ext:   Left lower Extremity    Dressing c/d/i   Ext warm   + DP pulse   Swelling stable   Assessment and Plan   POD/HD#: 1  19 y/o male with L thigh soft tissue abscess, pelvic abscess and osteomyelitis   1. Thigh abscess, Pelvic abscess and osteomyelitis s/p serial I&D's and removal of plates                           Return to OR for final washout tomorrow or thursday                             Will progress therapy at this time                          wbat L leg  WBAT R leg   No ROM restrictions                          pt switched back to rocephin and placed on flagyl, headaches with flagyl                Appreciate ID   2. Pain management:                         Pt tolerating better             Continue with current regimen   3. ABL anemia/Hemodynamics             Stable  4. Medical issues               Pre-diabetes                        dietary mods                Acute renal insufficiency                        Normalized    Order for US renal arteries not done yet               Iron Deficiency                           Pt with low iron and iron stores                         Given presence of active infection will hold on supplementation                         Recheck iron/ferritn/transferrin levels after infection cleared               Vitamin D and testosterone deficiency                           Supplement vitamin d                         Recheck in 12 weeks or so   5. DVT/PE prophylaxis:            lovenox   6. ID:               See #1  Back on rocephin and flagyl  Discuss with ID flagyl and headache              initial Pelvic abscess cultures grew out peptostreptococcus                     New cultures without any growth    7. FEN/Foley/Lines:             CHO mod diet    Npo after MN                8. Dispo:                       return to OR tomorrow or Thursday  Dc home Friday or Saturday    Jari Pigg, PA-C [facility] [phone] (  O) 06/17/2015 9:17 AM
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
## 2015-06-18 ENCOUNTER — Inpatient Hospital Stay (HOSPITAL_COMMUNITY): Payer: Commercial Managed Care - HMO | Admitting: Anesthesiology

## 2015-06-18 ENCOUNTER — Encounter (HOSPITAL_COMMUNITY)
Admission: EM | Disposition: A | Discharge status: Home / Self Care | Payer: Self-pay | Source: Home / Self Care | Attending: Orthopedic Surgery

## 2015-06-18 ENCOUNTER — Encounter (HOSPITAL_COMMUNITY): Payer: Self-pay | Admitting: Certified Registered"

## 2015-06-18 DIAGNOSIS — R51 Headache: Secondary | ICD-10-CM

## 2015-06-18 DIAGNOSIS — R519 Headache, unspecified: Secondary | ICD-10-CM | POA: Insufficient documentation

## 2015-06-18 HISTORY — PX: WOUND DEBRIDEMENT: SHX247

## 2015-06-18 LAB — GLUCOSE, CAPILLARY
GLUCOSE-CAPILLARY: 88 mg/dL (ref 65–99)
GLUCOSE-CAPILLARY: 115 mg/dL — AB (ref 65–99)
Glucose-Capillary: 104 mg/dL — ABNORMAL HIGH (ref 65–99)

## 2015-06-18 LAB — CK: Total CK: 166 U/L (ref 49–397)

## 2015-06-18 SURGERY — IRRIGATION AND DEBRIDEMENT, WOUND, ABDOMEN, WITH CLOSURE
Anesthesia: General | Site: Abdomen

## 2015-06-18 MED ORDER — FENTANYL CITRATE (PF) 100 MCG/2ML IJ SOLN
INTRAMUSCULAR | Status: DC | PRN
  Administered 2015-06-18 (×2): 100 ug via INTRAVENOUS
  Administered 2015-06-18: 50 ug via INTRAVENOUS

## 2015-06-18 MED ORDER — LIDOCAINE HCL (CARDIAC) 20 MG/ML IV SOLN
INTRAVENOUS | Status: DC | PRN
  Administered 2015-06-18: 80 mg via INTRAVENOUS

## 2015-06-18 MED ORDER — LIDOCAINE HCL (CARDIAC) 20 MG/ML IV SOLN
INTRAVENOUS | Status: AC
  Filled 2015-06-18: qty 5

## 2015-06-18 MED ORDER — PROPOFOL 10 MG/ML IV BOLUS
INTRAVENOUS | Status: AC
  Filled 2015-06-18: qty 20

## 2015-06-18 MED ORDER — MIDAZOLAM HCL 2 MG/2ML IJ SOLN
INTRAMUSCULAR | Status: AC
Start: 2015-06-18 — End: 2015-06-18
  Filled 2015-06-18: qty 4

## 2015-06-18 MED ORDER — MIDAZOLAM HCL 5 MG/5ML IJ SOLN
INTRAMUSCULAR | Status: DC | PRN
  Administered 2015-06-18: 2 mg via INTRAVENOUS

## 2015-06-18 MED ORDER — 0.9 % SODIUM CHLORIDE (POUR BTL) OPTIME
TOPICAL | Status: DC | PRN
  Administered 2015-06-18: 1000 mL

## 2015-06-18 MED ORDER — ONDANSETRON HCL 4 MG/2ML IJ SOLN
INTRAMUSCULAR | Status: AC
  Filled 2015-06-18: qty 2

## 2015-06-18 MED ORDER — NEOSTIGMINE METHYLSULFATE 10 MG/10ML IV SOLN
INTRAVENOUS | Status: DC | PRN
  Administered 2015-06-18: 3 mg via INTRAVENOUS

## 2015-06-18 MED ORDER — HYDROMORPHONE HCL 1 MG/ML IJ SOLN
0.2500 mg | INTRAMUSCULAR | Status: DC | PRN
  Administered 2015-06-18 (×2): 1 mg via INTRAVENOUS

## 2015-06-18 MED ORDER — PROMETHAZINE HCL 25 MG/ML IJ SOLN: 6.2500 mg | INTRAMUSCULAR | Status: DC | PRN

## 2015-06-18 MED ORDER — ONDANSETRON HCL 4 MG/2ML IJ SOLN
INTRAMUSCULAR | Status: AC
Start: 2015-06-18 — End: 2015-06-18
  Filled 2015-06-18: qty 2

## 2015-06-18 MED ORDER — HYDROMORPHONE HCL 1 MG/ML IJ SOLN
INTRAMUSCULAR | Status: DC | PRN
  Administered 2015-06-18: 1 mg via INTRAVENOUS

## 2015-06-18 MED ORDER — NEOSTIGMINE METHYLSULFATE 10 MG/10ML IV SOLN
INTRAVENOUS | Status: AC
  Filled 2015-06-18: qty 1

## 2015-06-18 MED ORDER — ROCURONIUM BROMIDE 100 MG/10ML IV SOLN
INTRAVENOUS | Status: DC | PRN
  Administered 2015-06-18: 50 mg via INTRAVENOUS

## 2015-06-18 MED ORDER — FENTANYL CITRATE (PF) 250 MCG/5ML IJ SOLN
INTRAMUSCULAR | Status: AC
  Filled 2015-06-18: qty 5

## 2015-06-18 MED ORDER — LACTATED RINGERS IV SOLN
INTRAVENOUS | Status: DC | PRN
  Administered 2015-06-18 (×2): via INTRAVENOUS

## 2015-06-18 MED ORDER — PROPOFOL 10 MG/ML IV BOLUS
INTRAVENOUS | Status: DC | PRN
  Administered 2015-06-18: 200 mg via INTRAVENOUS

## 2015-06-18 MED ORDER — GLYCOPYRROLATE 0.2 MG/ML IJ SOLN
INTRAMUSCULAR | Status: AC
  Filled 2015-06-18: qty 2

## 2015-06-18 MED ORDER — ROCURONIUM BROMIDE 50 MG/5ML IV SOLN
INTRAVENOUS | Status: AC
  Filled 2015-06-18: qty 1

## 2015-06-18 MED ORDER — PHENYLEPHRINE 40 MCG/ML (10ML) SYRINGE FOR IV PUSH (FOR BLOOD PRESSURE SUPPORT)
PREFILLED_SYRINGE | INTRAVENOUS | Status: AC
  Filled 2015-06-18: qty 10

## 2015-06-18 MED ORDER — HYDROMORPHONE HCL 1 MG/ML IJ SOLN
INTRAMUSCULAR | Status: AC
Start: 2015-06-18 — End: 2015-06-18
  Filled 2015-06-18: qty 1

## 2015-06-18 MED ORDER — SUGAMMADEX SODIUM 200 MG/2ML IV SOLN
INTRAVENOUS | Status: AC
  Filled 2015-06-18: qty 2

## 2015-06-18 MED ORDER — HYDROMORPHONE HCL 1 MG/ML IJ SOLN
INTRAMUSCULAR | Status: AC
  Filled 2015-06-18: qty 2

## 2015-06-18 MED ORDER — GLYCOPYRROLATE 0.2 MG/ML IJ SOLN
INTRAMUSCULAR | Status: DC | PRN
  Administered 2015-06-18: 0.4 mg via INTRAVENOUS

## 2015-06-18 SURGICAL SUPPLY — 42 items
APL SKNCLS STERI-STRIP NONHPOA (GAUZE/BANDAGES/DRESSINGS)
BENZOIN TINCTURE PRP APPL 2/3 (GAUZE/BANDAGES/DRESSINGS) IMPLANT
CANISTER SUCTION 2500CC (MISCELLANEOUS) ×3 IMPLANT
CANISTER WOUND CARE 500ML ATS (WOUND CARE) ×3 IMPLANT
COVER SURGICAL LIGHT HANDLE (MISCELLANEOUS) ×3 IMPLANT
DRAIN JACKSON PRATT 10MM FLAT (MISCELLANEOUS) ×2 IMPLANT
DRAPE LAPAROSCOPIC ABDOMINAL (DRAPES) ×3 IMPLANT
DRAPE UTILITY XL STRL (DRAPES) ×6 IMPLANT
DRSG AQUACEL AG ADV 3.5X10 (GAUZE/BANDAGES/DRESSINGS) ×2 IMPLANT
DRSG VAC ATS LRG SENSATRAC (GAUZE/BANDAGES/DRESSINGS) IMPLANT
DRSG VERSA FOAM LRG 10X15 (GAUZE/BANDAGES/DRESSINGS) IMPLANT
ELECT REM PT RETURN 9FT ADLT (ELECTROSURGICAL) ×3
ELECTRODE REM PT RTRN 9FT ADLT (ELECTROSURGICAL) ×1 IMPLANT
EVACUATOR SILICONE 100CC (DRAIN) ×2 IMPLANT
GLOVE BIOGEL PI IND STRL 7.0 (GLOVE) IMPLANT
GLOVE BIOGEL PI IND STRL 8 (GLOVE) ×1 IMPLANT
GLOVE BIOGEL PI IND STRL 8.5 (GLOVE) IMPLANT
GLOVE BIOGEL PI INDICATOR 7.0 (GLOVE) ×2
GLOVE BIOGEL PI INDICATOR 8 (GLOVE) ×2
GLOVE BIOGEL PI INDICATOR 8.5 (GLOVE) ×2
GLOVE ECLIPSE 7.5 STRL STRAW (GLOVE) ×3 IMPLANT
GLOVE SURG SS PI 7.0 STRL IVOR (GLOVE) ×2 IMPLANT
GLOVE SURG SS PI 8.5 STRL IVOR (GLOVE) ×2
GLOVE SURG SS PI 8.5 STRL STRW (GLOVE) IMPLANT
GOWN STRL REUS W/ TWL LRG LVL3 (GOWN DISPOSABLE) ×2 IMPLANT
GOWN STRL REUS W/TWL LRG LVL3 (GOWN DISPOSABLE) ×6
KIT BASIN OR (CUSTOM PROCEDURE TRAY) ×3 IMPLANT
KIT ROOM TURNOVER OR (KITS) ×3 IMPLANT
NS IRRIG 1000ML POUR BTL (IV SOLUTION) ×3 IMPLANT
PACK GENERAL/GYN (CUSTOM PROCEDURE TRAY) ×3 IMPLANT
PAD ARMBOARD 7.5X6 YLW CONV (MISCELLANEOUS) ×6 IMPLANT
SPONGE ABDOMINAL VAC ABTHERA (MISCELLANEOUS) ×3 IMPLANT
SPONGE GAUZE 4X4 12PLY STER LF (GAUZE/BANDAGES/DRESSINGS) ×2 IMPLANT
SUCTION POOLE TIP (SUCTIONS) IMPLANT
SUT ETHILON 2 0 FS 18 (SUTURE) ×8 IMPLANT
SUT PDS AB 2-0 CT1 27 (SUTURE) ×4 IMPLANT
SUT VIC AB 2-0 CT1 27 (SUTURE) ×6
SUT VIC AB 2-0 CT1 TAPERPNT 27 (SUTURE) IMPLANT
TAPE CLOTH SURG 4X10 WHT LF (GAUZE/BANDAGES/DRESSINGS) ×2 IMPLANT
TOWEL OR 17X24 6PK STRL BLUE (TOWEL DISPOSABLE) ×3 IMPLANT
TOWEL OR 17X26 10 PK STRL BLUE (TOWEL DISPOSABLE) ×3 IMPLANT
WATER STERILE IRR 1000ML POUR (IV SOLUTION) IMPLANT

## 2015-06-18 NOTE — Progress Notes (Signed)
Pt asks for pain med repeatedly, saying "I can get pain medicine every 5 minutes,can't I"...... Explained to him that is not necessarily so, as it is based on variopus factors a nurse uses to assess pain status and patient safety including patient presentation and vital signs. Our goal is to make pain tolerable, yet it will noty be fully relieved. He states he does not really understand the pain scale of 0-10. Attempt made to explain same to him, yet he again can't seem to quantify his level. (refer to [facility] for scoring by staff).

## 2015-06-18 NOTE — Op Note (Signed)
OPERATIVE REPORT  DATE OF OPERATION:  06/18/2015  PATIENT:  Mathew Norman  19 y.o. male  PRE-OPERATIVE DIAGNOSIS:  Resolved pelvic osteomyelitis with open wound measuring 7cm x 2cm x 2cm (depth)  POST-OPERATIVE DIAGNOSIS:  Same  PROCEDURE:  Procedure(s): INCISION AND DRAINAGE, CLOSURE ABDOMINAL WOUND WITH AQUACELL PLACEMENT With resultant 15cm incision at the end SURGEON:  Surgeon(s): Jimmye Norman, MD  ASSISTANT: None  ANESTHESIA:   general  EBL: <20 ml  BLOOD ADMINISTERED: none  DRAINS: (10mm) Jackson-Pratt drain(s) with closed bulb suction in the deep suprapubic subcutaneous tissue   SPECIMEN:  No Specimen  COUNTS CORRECT:  YES  PROCEDURE DETAILS: The patient was taken to the operating room and placed on the table in the supine position. After an adequate general endotracheal an aesthetic was administered he was prepped and draped after the negative pressure wound dressing was removed with Betadine in the usual sterile manner.  A proper timeout was performed identifying the patient and the procedure to be performed. We irrigated the wound after removal of all the negative pressure wound dressing sponges from the wound. There was no evidence of purulent drainage in any part of the wound. There was exposed  pubic bone at the depth of the wound which had some covering of periosteum.  We subsequently placed a 10 mm flat Jackson-Pratt drain at the base of the wound was brought out the right lateral aspect of the wound. The drain was sutured in place with 2-0 nylon.  We subsequently closed the subcutaneous tissue on top of the Jackson-Pratt drain using interrupted 2-0 PDS suture. The skin was then closed using vertical mattress sutures of 2-0 nylon. A sterile dressing of AquaCell Ag was placed on the wound. All needle counts, sponge counts, and instrument counts were correct.  PATIENT DISPOSITION:  PACU - hemodynamically stable.   Mathew Norman 10/5/20169:41 AM

## 2015-06-18 NOTE — Anesthesia Procedure Notes (Signed)
Procedure Name: Intubation Date/Time: 06/18/2015 8:45 AM Performed by: Arlice Colt B Pre-anesthesia Checklist: Patient identified, Emergency Drugs available, Suction available, Patient being monitored and Timeout performed Patient Re-evaluated:Patient Re-evaluated prior to inductionOxygen Delivery Method: Circle system utilized Preoxygenation: Pre-oxygenation with 100% oxygen Intubation Type: IV induction Ventilation: Mask ventilation without difficulty Laryngoscope Size: Mac and 3 Grade View: Grade I Tube type: Oral Tube size: 7.5 mm Number of attempts: 1 Airway Equipment and Method: Stylet Placement Confirmation: ETT inserted through vocal cords under direct vision,  positive ETCO2 and breath sounds checked- equal and bilateral Secured at: 22 cm Tube secured with: Tape Dental Injury: Teeth and Oropharynx as per pre-operative assessment

## 2015-06-18 NOTE — Transfer of Care (Signed)
Immediate Anesthesia Transfer of Care Note  Patient: Mathew Norman  Procedure(s) Performed: Procedure(s): INCISION AND DRAINAGE CLOSURE ABDOMINAL WOUND WITH AQUACELL PLACEMENT  (N/A)  Patient Location: PACU  Anesthesia Type:General  Level of Consciousness: awake, alert  and oriented  Airway & Oxygen Therapy: Patient Spontanous Breathing  Post-op Assessment: Report given to RN and Post -op Vital signs reviewed and stable  Post vital signs: Reviewed and stable  Last Vitals:  Filed Vitals:   06/18/15 0506  BP: 145/84  Pulse: 70  Temp: 36.8 C  Resp: 17    Complications: No apparent anesthesia complications

## 2015-06-18 NOTE — Anesthesia Preprocedure Evaluation (Signed)
Anesthesia Evaluation  Patient identified by MRN, date of birth, ID band Patient awake    Reviewed: Allergy & Precautions, NPO status , Patient's Chart, lab work & pertinent test results  Airway Mallampati: II  TM Distance: <3 FB Neck ROM: Full    Dental no notable dental hx.    Pulmonary neg pulmonary ROS,    Pulmonary exam normal breath sounds clear to auscultation       Cardiovascular negative cardio ROS Normal cardiovascular exam Rhythm:Regular Rate:Normal     Neuro/Psych negative neurological ROS  negative psych ROS   GI/Hepatic negative GI ROS, Neg liver ROS,   Endo/Other  Morbid obesity  Renal/GU negative Renal ROS  negative genitourinary   Musculoskeletal negative musculoskeletal ROS (+)   Abdominal   Peds negative pediatric ROS (+)  Hematology negative hematology ROS (+)   Anesthesia Other Findings   Reproductive/Obstetrics negative OB ROS                             Anesthesia Physical Anesthesia Plan  ASA: II  Anesthesia Plan: General   Post-op Pain Management:    Induction: Intravenous  Airway Management Planned: Oral ETT  Additional Equipment:   Intra-op Plan:   Post-operative Plan: Extubation in OR  Informed Consent: I have reviewed the patients History and Physical, chart, labs and discussed the procedure including the risks, benefits and alternatives for the proposed anesthesia with the patient or authorized representative who has indicated his/her understanding and acceptance.   Dental advisory given  Plan Discussed with: CRNA and Surgeon  Anesthesia Plan Comments:         Anesthesia Quick Evaluation  

## 2015-06-18 NOTE — Anesthesia Postprocedure Evaluation (Signed)
  Anesthesia Post-op Note  Patient: Mathew Norman  Procedure(s) Performed: Procedure(s) (LRB): INCISION AND DRAINAGE CLOSURE ABDOMINAL WOUND WITH AQUACELL PLACEMENT  (N/A)  Patient Location: PACU  Anesthesia Type: General  Level of Consciousness: awake and alert   Airway and Oxygen Therapy: Patient Spontanous Breathing  Post-op Pain: mild  Post-op Assessment: Post-op Vital signs reviewed, Patient's Cardiovascular Status Stable, Respiratory Function Stable, Patent Airway and No signs of Nausea or vomiting  Last Vitals:  Filed Vitals:   06/18/15 0956  BP: 152/91  Pulse:   Temp:   Resp: 17    Post-op Vital Signs: stable   Complications: No apparent anesthesia complications

## 2015-06-19 ENCOUNTER — Encounter (HOSPITAL_COMMUNITY): Payer: Self-pay | Admitting: General Surgery

## 2015-06-19 LAB — GLUCOSE, CAPILLARY
GLUCOSE-CAPILLARY: 120 mg/dL — AB (ref 65–99)
Glucose-Capillary: 93 mg/dL (ref 65–99)

## 2015-06-19 MED ORDER — HEPARIN SOD (PORK) LOCK FLUSH 100 UNIT/ML IV SOLN
250.0000 [IU] | INTRAVENOUS | Status: AC | PRN
  Administered 2015-06-19: 250 [IU]
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
## 2015-06-19 NOTE — Progress Notes (Signed)
AVS discharge instructions were reviewed with patient. Patient was given prescriptions to take to his pharmacy. Patient was shown how to empty his JP drain and given written information on how to care for and drain JP. Patient stated that he understood how to drain and care for JP.  Patient is also going home with lovonox, patient stated that his step mother is familiar with given shots and drawing blood and that she would be able to give his lovonox. [facility] nurse stated that the nurse would help patient and his family with JP care and lovonox injections.  When patient is ready staff will assist to transportation with a wheelchair.

## 2015-06-19 NOTE — Care Management Note (Signed)
Case Management Note  Patient Details  Name: Mathew Norman MRN: [id number] Date of Birth: [birth year]  Subjective/Objective:                    Action/Plan:   Expected Discharge Date:                  Expected Discharge Plan:  Home w Home Health Services  In-House Referral:     Discharge planning Services  CM Consult  Post Acute Care Choice:  Home Health Choice offered to:  Patient  DME Arranged:    DME Agency:     HH Arranged:  RN, IV Antibiotics, PT HH Agency:  [facility]  Status of Service:  In process, will continue to follow  Medicare Important Message Given:    Date Medicare IM Given:    Medicare IM give by:    Date Additional Medicare IM Given:    Additional Medicare Important Message give by:     If discussed at Long Length of Stay Meetings, dates discussed:  06-20-15  Additional Comments:  Kingsley Plan, RN 06/19/2015, 8:18 AM
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
## 2015-06-19 NOTE — Progress Notes (Signed)
Patient ID: JAYVEN NAILL, male   DOB: [birth year], 19 y.o.   MRN: [id number]   LOS: 24 days   Subjective: No problems with wound.   Objective: Vital signs in last 24 hours: Temp:  [98.3 F (36.8 C)-99 F (37.2 C)] 98.5 F (36.9 C) (10/06 0451) Pulse Rate:  [56-84] 77 (10/06 0451) Resp:  [15-18] 17 (10/06 0451) BP: (132-162)/(79-94) 143/94 mmHg (10/06 0451) SpO2:  [95 %-100 %] 100 % (10/06 0451) Last BM Date: 06/18/15   JP: 25ml/24h   Physical Exam Incision/Wound: C/D/I, JP in place   Assessment/Plan: Abdominal wound s/p closure -- No issues, can d/c home. Will touch base with Dr. Carola Frost later today if no one from ortho has seen pt to ascertain plan.    Freeman Caldron, PA-C Pager: [phone] General Trauma PA Pager: [phone]  06/19/2015

## 2015-06-19 NOTE — Progress Notes (Signed)
     Subjective:  POD#1 Repeat I/D abdomen and pelvis. Patient reports pain as mild to moderate.  Patient mobilized well with PT today.  Patient requesting to go home today. Infectious disease managing abx at discharge.   Objective:   VITALS:   Filed Vitals:   06/18/15 1026 06/18/15 2115 06/19/15 0128 06/19/15 0451  BP: 162/94 157/91 132/79 143/94  Pulse: 62 74 56 77  Temp: 98.3 F (36.8 C) 98.8 F (37.1 C) 98.4 F (36.9 C) 98.5 F (36.9 C)  TempSrc: Oral Oral Oral Oral  Resp: Height:      Weight:      SpO2: 99% 100% 100% 100%    Neurologically intact ABD soft Neurovascular intact Sensation intact distally Intact pulses distally Incision: dressing C/D/I   Lab Results  Component Value Date   WBC 5.1 06/12/2015   HGB 8.2* 06/12/2015   HCT 26.1* 06/12/2015   MCV 83.4 06/12/2015   PLT 382 06/12/2015   BMET    Component Value Date/Time   NA 139 06/12/2015 0545   K 3.4* 06/12/2015 0545   CL 104 06/12/2015 0545   CO2 27 06/12/2015 0545   GLUCOSE 98 06/12/2015 0545   BUN 5* 06/12/2015 0545   CREATININE 1.04 06/12/2015 0545   CALCIUM 9.1 06/12/2015 0545   CALCIUM 8.7 05/30/2015 0530   GFRNONAA >60 06/12/2015 0545   GFRAA >60 06/12/2015 0545     Assessment/Plan: 1 Day Post-Op   Principal Problem:   Osteomyelitis, pelvis (HCC) Active Problems:   Prediabetes   Acute renal insufficiency   Iron deficiency   Vitamin D deficiency   Post-operative wound abscess   Pelvic abscess in male   Abscess of male pelvis (HCC)   Infection due to Peptostreptococcus species   Headache above the eye region   Up with therapy WBAT in the BLE Will plan for discharge today as long as home health has been set up to manage abx.    Kahlyn Shippey Hilda Lias 06/19/2015, 12:03 PM Cell [phone]

## 2015-06-19 NOTE — Op Note (Signed)
NAMEMarland Kitchen  KOREN, SERMERSHEIM NO.:  [id number]  MEDICAL RECORD NO.:  [id number]  LOCATION:  6N29C                        FACILITY:  [facility]  PHYSICIAN:  Doralee Albino. Carola Frost, M.D. DATE OF BIRTH:  [birth year]  DATE OF PROCEDURE:  06/16/2015 DATE OF DISCHARGE:                              OPERATIVE REPORT   PREOPERATIVE DIAGNOSES:  Abdominal wound infection and pelvic osteomyelitis.  POSTOPERATIVE DIAGNOSES:  Abdominal wound infection and pelvic osteomyelitis.  PROCEDURES: 1. Debridement of skin and subcutaneous tissue with curettage. 2. Partial layered closure. 3. Application of small wound VAC.  SURGEON:  Doralee Albino. Carola Frost, M.D.  ASSISTANT:  Mearl Latin, PA-C.  ANESTHESIA:  General.  COMPLICATIONS:  None.  SPECIMENS:  None.  DISPOSITION:  To PACU.  CONDITION:  Stable.  BRIEF SUMMARY OF INDICATION FOR PROCEDURE:  Mr. Zurawski is a very pleasant 19 year old male who returns now for possible closure versus last VAC application following pelvic osteomyelitis, complicating repair of a pelvic ring and acetabular fracture.  He has had maintained fixation.  X-rays have shown no loss of reduction and there has been interval improvement in his deep tissues.  I discussed with him the risks and benefits of surgery including the possibility of yet more surgery, potential for loss of reduction, urologic injury, DVT, PE, and many others.  He acknowledged these risks and did wish to proceed.  BRIEF SUMMARY OF PROCEDURE:  Abdifatah was taken to the OR where general anesthesia was induced.  His wound VAC was removed and the abdomen was prepped and draped in usual sterile fashion without Betadine going directly onto the granulation tissue, but rather around the skin edge. The wound looked quite good within and there were few areas that required curettage along the skin and subcutaneous tissue back to healthy edges.  This was irrigated thoroughly and then a complex layered closure  performed of 8 cm or so of wound using 0 PDS and large 2-0 nylon vertical mattress retention-type sutures and then the wound VAC was placed in the center in order to close and eliminate the dead space using combination of white-black foam.  [name], PA-C assisted throughout.  The patient was taken to the PACU in stable condition.  PROGNOSIS:  Clearly, Mr. Mattern is improving at this time and we anticipate only one more procedure for definitive closure.  That being said, he has not tolerated the Flagyl well and an antibiotic change is anticipated and this has been discussed with Dr. Staci Righter from the Infectious Disease Service.  Because of commitments that a conference out of town, one of my General Surgery colleagues will assist with the final procedure and closure by perform that operation in the next 2-3 days.  I will plan to see him back in a week.  He will remain on antibiotics through his PICC line and we have recommended close evaluation by primary care physician to assist with control of glucose and renal function, which is also nearly normalized at this point.     Doralee Albino. Carola Frost, M.D.     MHH/MEDQ  D:  06/18/2015  T:  06/19/2015  Job:  [id number]
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
## 2015-07-16 ENCOUNTER — Ambulatory Visit (INDEPENDENT_AMBULATORY_CARE_PROVIDER_SITE_OTHER): Payer: Commercial Managed Care - HMO | Admitting: Infectious Diseases

## 2015-07-16 ENCOUNTER — Telehealth: Payer: Self-pay | Admitting: *Deleted

## 2015-07-16 ENCOUNTER — Encounter: Payer: Self-pay | Admitting: Infectious Diseases

## 2015-07-16 VITALS — BP 127/85 | HR 82 | Temp 98.3°F | Ht 67.0 in | Wt 246.0 lb

## 2015-07-16 DIAGNOSIS — N739 Female pelvic inflammatory disease, unspecified: Secondary | ICD-10-CM

## 2015-07-16 DIAGNOSIS — T814XXD Infection following a procedure, subsequent encounter: Secondary | ICD-10-CM | POA: Diagnosis not present

## 2015-07-16 DIAGNOSIS — K651 Peritoneal abscess: Secondary | ICD-10-CM

## 2015-07-16 DIAGNOSIS — IMO0001 Reserved for inherently not codable concepts without codable children: Secondary | ICD-10-CM

## 2015-07-16 MED ORDER — LEVOFLOXACIN 500 MG PO TABS: 500.0000 mg | ORAL_TABLET | Freq: Every day | ORAL | Status: DC

## 2015-07-16 NOTE — Assessment & Plan Note (Signed)
He is doing very well. I am some concern about his wound drainage. Will stop his anbx, pull his PIC on the 19th as planned. Start oral levaquin after he completes IV, return to ID clinic in 2 months.  Will recheck his ESR and CRP at that time.
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
## 2015-07-16 NOTE — Assessment & Plan Note (Signed)
This appears to be an error. Will mark as resolved.

## 2015-07-16 NOTE — Telephone Encounter (Signed)
Per Dr. Ninetta LightsHatcher, [facility] at end of IV antibiotic therapy on 11/19.  The patient will start oral antibiotics on 11/20. Also noted that the patient stated his homehealth RN was unable to change the caps on his PICC this week and hoped we would be able to do so at his office visit.  PICC caps not available at [facility], [facility]. Andree CossHowell, Michelle M, RN

## 2015-07-30 NOTE — Telephone Encounter (Signed)
[facility].  This RN shared that [facility].  [facility] verbalized back the PICC d/c order for 08/02/15.

## 2015-10-16 ENCOUNTER — Ambulatory Visit (INDEPENDENT_AMBULATORY_CARE_PROVIDER_SITE_OTHER): Payer: Commercial Managed Care - HMO | Admitting: Infectious Diseases

## 2015-10-16 ENCOUNTER — Encounter: Payer: Self-pay | Admitting: Infectious Diseases

## 2015-10-16 VITALS — BP 155/93 | HR 54 | Temp 98.4°F | Ht 66.5 in | Wt 259.0 lb

## 2015-10-16 DIAGNOSIS — IMO0001 Reserved for inherently not codable concepts without codable children: Secondary | ICD-10-CM

## 2015-10-16 DIAGNOSIS — T814XXD Infection following a procedure, subsequent encounter: Secondary | ICD-10-CM

## 2015-10-16 LAB — C-REACTIVE PROTEIN
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
## 2015-10-16 NOTE — Assessment & Plan Note (Addendum)
He is doing very well. Will repeat his ESR and CRP. Her reports his last films done at his orthopedists office were normal.  Will stop his anbx today. Will call him back if any abnormalities.

## 2015-10-17 LAB — SEDIMENTATION RATE: Sed Rate: 1 mm/hr (ref 0–15)
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
## 2024-03-09 ENCOUNTER — Ambulatory Visit

## 2024-03-09 ENCOUNTER — Ambulatory Visit: Admission: EM | Admit: 2024-03-09 | Discharge: 2024-03-09 | Disposition: A | Discharge status: Home / Self Care

## 2024-03-09 DIAGNOSIS — S39012A Strain of muscle, fascia and tendon of lower back, initial encounter: Secondary | ICD-10-CM | POA: Diagnosis not present

## 2024-03-09 DIAGNOSIS — M62830 Muscle spasm of back: Secondary | ICD-10-CM | POA: Diagnosis not present

## 2024-03-09 HISTORY — DX: Essential (primary) hypertension: I10

## 2024-03-09 MED ORDER — METHOCARBAMOL 500 MG PO TABS: 500.0000 mg | ORAL_TABLET | Freq: Three times a day (TID) | ORAL | 0 refills | Status: AC | PRN

## 2024-03-09 MED ORDER — CELECOXIB 200 MG PO CAPS: 200.0000 mg | ORAL_CAPSULE | Freq: Every day | ORAL | 0 refills | Status: AC
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
## 2024-03-09 NOTE — Discharge Instructions (Addendum)
 Advised patient of low back x-ray results with hardcopy provided.  Advised patient to take medication as directed with food to completion.  Advised may use Robaxin  daily or as needed for accompanying muscle spasms of back.  Advised if symptoms worsen and/or unresolved please follow-up with your PCP, [facility] (contact information provided with his AVS this morning), or here for further evaluation.
